# Patient Record
Sex: Female | Born: 1995
Health system: Southern US, Community
[De-identification: ages and names within clinical notes are randomized; demographics above are authoritative.]

## PROBLEM LIST (undated history)

## (undated) DIAGNOSIS — G43909 Migraine, unspecified, not intractable, without status migrainosus: Secondary | ICD-10-CM

## (undated) DIAGNOSIS — R519 Headache, unspecified: Secondary | ICD-10-CM

## (undated) DIAGNOSIS — F4310 Post-traumatic stress disorder, unspecified: Secondary | ICD-10-CM

## (undated) DIAGNOSIS — F509 Eating disorder, unspecified: Secondary | ICD-10-CM

## (undated) DIAGNOSIS — F329 Major depressive disorder, single episode, unspecified: Secondary | ICD-10-CM

## (undated) DIAGNOSIS — F419 Anxiety disorder, unspecified: Secondary | ICD-10-CM

## (undated) DIAGNOSIS — F32A Depression, unspecified: Secondary | ICD-10-CM

## (undated) DIAGNOSIS — R51 Headache: Secondary | ICD-10-CM

## (undated) DIAGNOSIS — F909 Attention-deficit hyperactivity disorder, unspecified type: Secondary | ICD-10-CM

## (undated) DIAGNOSIS — F429 Obsessive-compulsive disorder, unspecified: Secondary | ICD-10-CM

## (undated) DIAGNOSIS — F603 Borderline personality disorder: Secondary | ICD-10-CM

## (undated) DIAGNOSIS — T7840XA Allergy, unspecified, initial encounter: Secondary | ICD-10-CM

## (undated) HISTORY — DX: Anxiety disorder, unspecified: F41.9

## (undated) HISTORY — DX: Attention-deficit hyperactivity disorder, unspecified type: F90.9

## (undated) HISTORY — DX: Headache, unspecified: R51.9

## (undated) HISTORY — DX: Headache: R51

## (undated) HISTORY — DX: Major depressive disorder, single episode, unspecified: F32.9

## (undated) HISTORY — DX: Allergy, unspecified, initial encounter: T78.40XA

## (undated) HISTORY — DX: Eating disorder, unspecified: F50.9

## (undated) HISTORY — DX: Post-traumatic stress disorder, unspecified: F43.10

## (undated) HISTORY — DX: Depression, unspecified: F32.A

## (undated) HISTORY — DX: Migraine, unspecified, not intractable, without status migrainosus: G43.909

---
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
## 2017-01-03 ENCOUNTER — Encounter: Payer: Self-pay | Admitting: Urology

## 2017-01-03 ENCOUNTER — Ambulatory Visit (INDEPENDENT_AMBULATORY_CARE_PROVIDER_SITE_OTHER): Payer: No Typology Code available for payment source | Admitting: Urology

## 2017-01-03 VITALS — BP 117/78 | HR 91 | Ht 62.0 in | Wt 108.0 lb

## 2017-01-03 DIAGNOSIS — N390 Urinary tract infection, site not specified: Secondary | ICD-10-CM

## 2017-01-03 LAB — URINALYSIS, COMPLETE
BILIRUBIN UA: NEGATIVE
GLUCOSE, UA: NEGATIVE
KETONES UA: NEGATIVE
Nitrite, UA: NEGATIVE
PH UA: 7 (ref 5.0–7.5)
PROTEIN UA: NEGATIVE
RBC, UA: NEGATIVE
SPEC GRAV UA: 1.02 (ref 1.005–1.030)
UUROB: 0.2 mg/dL (ref 0.2–1.0)

## 2017-01-03 NOTE — Progress Notes (Signed)
01/03/2017 9:04 AM   Cynthia Castaneda [birth year] [id number]  Referring provider: Malva Limes, MD [address] [place], [place] [address]  Chief Complaint  Patient presents with  . Recurrent UTI    5wk f/u    HPI: The patient in the last 4 or 5 months has had recurrent bladder infections with dysuria frequency and urgency. Normally the symptoms respond antibiotics. She has been off and on the current prescription because she had lost it initially.  At baseline she voids every few hours and has no nocturia. She does have issues with constipation.    Pathophysiology of recurrent bladder infections discussed. Urine sent for culture. Renal ultrasound ordered. Return to clinic and likely recommend urinary prophylaxis. She is sexually active. Having said that I thought it was best to start her on prophylaxis today and see her back with a baseline ultrasound in several weeks  Today Frequency stable. Renal ultrasound normal. The patient is on daily trimethoprim. Last urine culture is negative. She has trouble to swallow medication. She has minimal discomfort at the end of urination  Urinalysis looks negative I sent it again for culture for her mild residual burning.   Overall she is doing great  PMH: Past Medical History:  Diagnosis Date  . ADHD (attention deficit hyperactivity disorder)   . Allergy   . Anxiety   . Depression   . Eating disorder   . Frequent headaches   . Migraine   . PTSD (post-traumatic stress disorder)     Surgical History: No past surgical history on file.  Home Medications:  Allergies as of 01/03/2017      Reactions   Amoxicillin Other (See Comments)   Family history      Medication List       Accurate as of 01/03/17  9:04 AM. Always use your most recent med list.          hyoscyamine 0.125 MG SL tablet Commonly known as:  LEVSIN SL PLACE ONE TABLET UNDER THE TONGUE EVERY FOUR HOURS AS NEEDED FOR CRAMPING   sertraline 50  MG tablet Commonly known as:  ZOLOFT Take 50 mg by mouth daily.   trimethoprim 100 MG tablet Commonly known as:  TRIMPEX Take 1 tablet (100 mg total) by mouth daily.            Discharge Care Instructions        Start     Ordered   01/03/17 0000  Urinalysis, Complete     01/03/17 0845   01/03/17 0000  CULTURE, URINE COMPREHENSIVE     01/03/17 0901      Allergies:  Allergies  Allergen Reactions  . Amoxicillin Other (See Comments)    Family history    Family History: Family History  Problem Relation Age of Onset  . Depression Mother   . Mental illness Mother   . Depression Father   . Mental illness Father   . Depression Brother   . Mental illness Brother   . Bladder Cancer Neg Hx   . Kidney cancer Neg Hx     Social History:  reports that she has never smoked. She has never used smokeless tobacco. She reports that she does not drink alcohol or use drugs.  ROS: UROLOGY Frequent Urination?: No Hard to postpone urination?: No Burning/pain with urination?: Yes Get up at night to urinate?: Yes Leakage of urine?: No Urine stream starts and stops?: No Trouble starting stream?: No Do you have to strain to  urinate?: No Blood in urine?: No Urinary tract infection?: No Sexually transmitted disease?: No Injury to kidneys or bladder?: No Painful intercourse?: No Weak stream?: No Currently pregnant?: No Vaginal bleeding?: No Last menstrual period?: 8/?/2018  Gastrointestinal Nausea?: Yes Vomiting?: No Indigestion/heartburn?: No Diarrhea?: No Constipation?: Yes  Constitutional Fever: No Night sweats?: No Weight loss?: No Fatigue?: No  Skin Skin rash/lesions?: No Itching?: No  Eyes Blurred vision?: No Double vision?: No  Ears/Nose/Throat Sore throat?: No Sinus problems?: No  Hematologic/Lymphatic Swollen glands?: No Easy bruising?: No  Cardiovascular Leg swelling?: No Chest pain?: Yes  Respiratory Cough?: No Shortness of breath?:  No  Endocrine Excessive thirst?: No  Musculoskeletal Back pain?: Yes Joint pain?: No  Neurological Headaches?: Yes Dizziness?: Yes  Psychologic Depression?: Yes Anxiety?: Yes  Physical Exam: BP 117/78   Pulse 91   Ht  (1.575 m)   Wt 108 lb (49 kg)   LMP 12/06/2016 (Approximate)   BMI 19.75 kg/m   Constitutional:  Alert and oriented, No acute distress.   Laboratory Data:  Urinalysis    Component Value Date/Time   APPEARANCEUR Clear 11/28/2016 0926   GLUCOSEU Negative 11/28/2016 0926   BILIRUBINUR Negative 11/28/2016 0926   PROTEINUR Negative 11/28/2016 0926   UROBILINOGEN 0.2 11/03/2016 1200   NITRITE Negative 11/28/2016 0926   LEUKOCYTESUR Negative 11/28/2016 0926    Pertinent Imaging:r Assessment & Plan:  Reassess in one yea  1. Recurrent UTI  - Urinalysis, Complete   Return in about 1 year (around 01/03/2018).  Cynthia Sinner, MD  [facility] [address], [address] [place], [place] [address] [phone]

## 2017-01-06 LAB — CULTURE, URINE COMPREHENSIVE

## 2017-02-23 ENCOUNTER — Other Ambulatory Visit: Payer: Self-pay

## 2017-02-23 MED ORDER — SERTRALINE HCL 50 MG PO TABS: 50.0000 mg | ORAL_TABLET | Freq: Every day | ORAL | 0 refills | Status: DC

## 2017-02-23 NOTE — Telephone Encounter (Signed)
Have sent 30 day prescription, need o.v. Within next month.

## 2017-02-23 NOTE — Telephone Encounter (Signed)
Pt mom Beth called back and states pt is not taking Sertraline.  Pt is taking Lamictal.  Mom is note sure of the the dosage.  Pt has picked this up at [facility].  CB#[phone]/MW

## 2017-02-23 NOTE — Telephone Encounter (Signed)
Patient's mother called requesting a refill on sertraline. She reports that she was seen by psychiatry and she was not happy with them. She is wanting to know if you would be willing to manage her sertraline? She is out of medication. [facility]. Mother's contact info is correct. Thanks!

## 2017-03-03 ENCOUNTER — Encounter: Payer: Self-pay | Admitting: Family Medicine

## 2017-03-03 ENCOUNTER — Ambulatory Visit: Payer: No Typology Code available for payment source | Admitting: Family Medicine

## 2017-03-03 VITALS — BP 100/70 | HR 108 | Temp 98.0°F | Resp 16 | Wt 114.0 lb

## 2017-03-03 DIAGNOSIS — F339 Major depressive disorder, recurrent, unspecified: Secondary | ICD-10-CM | POA: Diagnosis not present

## 2017-03-03 MED ORDER — LAMOTRIGINE ER 50 MG PO TB24: 50.0000 mg | ORAL_TABLET | Freq: Every day | ORAL | 1 refills | Status: DC

## 2017-03-03 NOTE — Telephone Encounter (Signed)
Mother advised and appt Ivor Costamade-Nichoel Digiulio V Moya Duan, RMA

## 2017-03-03 NOTE — Progress Notes (Signed)
       Patient: Cynthia Castaneda Female    DOB: 1996/04/14   20 y.o.   MRN: [id number] Visit Date: 03/03/2017  Today's Provider: Mila Merryonald Fisher, MD   Chief Complaint  Patient presents with  . Follow-up  . Depression   Subjective:    HPI  Depression Previously followed by psychiatry but patient was unhappy with psychiatrist. Dr. Sherrie MustacheFisher refilled Zoloft 50 mg qd on 02/23/2017. She was started on Lamictal with Dr. Maryruth BunKapur which she feels is working well, but she does not want to continue follow up with her. She states she would like to establish with a different psychiatrist.      Allergies  Allergen Reactions  . Amoxicillin Other (See Comments)    Family history     Current Outpatient Medications:  .  LamoTRIgine 50 MG TB24 24 hour tablet, Take 50 mg daily by mouth., Disp: , Rfl:  .  hyoscyamine (LEVSIN SL) 0.125 MG SL tablet, PLACE ONE TABLET UNDER THE TONGUE EVERY FOUR HOURS AS NEEDED FOR CRAMPING, Disp: , Rfl:  .  sertraline (ZOLOFT) 50 MG tablet, Take 1 tablet (50 mg total) daily by mouth. (Patient not taking: Reported on 03/03/2017), Disp: 30 tablet, Rfl: 0 .  trimethoprim (TRIMPEX) 100 MG tablet, Take 1 tablet (100 mg total) by mouth daily. (Patient not taking: Reported on 03/03/2017), Disp: 30 tablet, Rfl: 11  Review of Systems  Constitutional: Negative for appetite change, chills, fatigue and fever.  Respiratory: Negative for chest tightness and shortness of breath.   Cardiovascular: Negative for chest pain and palpitations.  Gastrointestinal: Negative for abdominal pain, nausea and vomiting.  Neurological: Negative for dizziness and weakness.    Social History   Tobacco Use  . Smoking status: Never Smoker  . Smokeless tobacco: Never Used  Substance Use Topics  . Alcohol use: No   Objective:   BP 100/70 (BP Location: Right Arm, Patient Position: Sitting, Cuff Size: Normal)   Pulse (!) 108   Temp 98 F (36.7 C) (Oral)   Resp 16   Wt 114 lb (51.7 kg)   SpO2  99%   BMI 20.85 kg/m  Vitals:   03/03/17 1546  BP: 100/70  Pulse: (!) 108  Resp: 16  Temp: 98 F (36.7 C)  TempSrc: Oral  SpO2: 99%  Weight: 114 lb (51.7 kg)     Physical Exam  General appearance: alert, well developed, well nourished, cooperative and in no distress Head: Normocephalic, without obvious abnormality, atraumatic Respiratory: Respirations even and unlabored, normal respiratory rate Extremities: No gross deformities Skin: Skin color, texture, turgor normal. No rashes seen  Psych: Appropriate mood and affect. Neurologic: Mental status: Alert, oriented to person, place, and time, thought content appropriate.      Assessment & Plan:     1. Episode of recurrent major depressive disorder, unspecified depression episode severity (HCC) Doing will since started on Lamictal by Dr. Maryruth BunKapur, however she will not return to Dr. Maryruth BunKapur for follow up. She is fine with [place] to [place] to establish with another psychiatrist. Continue Lamictal, she is to call if she will run out before establishing with a new psychiatrist.  - Ambulatory referral to Psychiatry       Mila Merryonald Fisher, MD  [facility]

## 2017-05-03 ENCOUNTER — Telehealth: Payer: Self-pay | Admitting: Family Medicine

## 2017-05-03 NOTE — Telephone Encounter (Signed)
Pt's mom Beth contacted office for refill request on the following medications:  LamoTRIgine 50 MG TB24 24 hour tablet  [facility]  Beth stated that she knows Dr. Sherrie MustacheFisher wanted pt to see a psychiatrist to manage this Rx but mom stated she wasn't aware that pt was contacted months ago and given referral info and expected to schedule appt. Beth stated that she has contacted multiple offices and no one can see pt for 6 to 8 weeks. Waynetta SandyBeth is requesting Dr. Sherrie MustacheFisher approve the medication with enough refills to last until pt can be seen by a psychiatrist. Waynetta SandyBeth stated pt took her last dose of medication today. Please advise. Thanks TNP

## 2017-05-04 MED ORDER — LAMOTRIGINE ER 50 MG PO TB24
50.0000 mg | ORAL_TABLET | Freq: Every day | ORAL | 0 refills | Status: DC
Start: 2017-05-04 — End: 2017-08-30

## 2017-05-04 NOTE — Telephone Encounter (Signed)
Please advise 

## 2017-08-30 ENCOUNTER — Encounter: Payer: Self-pay | Admitting: Family Medicine

## 2017-08-30 ENCOUNTER — Ambulatory Visit: Payer: No Typology Code available for payment source | Admitting: Family Medicine

## 2017-08-30 VITALS — BP 116/74 | HR 82 | Temp 98.0°F | Resp 16 | Wt 109.0 lb

## 2017-08-30 DIAGNOSIS — N3090 Cystitis, unspecified without hematuria: Secondary | ICD-10-CM

## 2017-08-30 LAB — POCT URINALYSIS DIPSTICK
Appearance: NORMAL
Blood, UA: NEGATIVE
Glucose, UA: NEGATIVE
Ketones, UA: NEGATIVE
NITRITE UA: POSITIVE
Odor: NORMAL
PROTEIN UA: NEGATIVE
SPEC GRAV UA: 1.02 (ref 1.010–1.025)
Urobilinogen, UA: 0.2 E.U./dL
pH, UA: 6.5 (ref 5.0–8.0)

## 2017-08-30 MED ORDER — SULFAMETHOXAZOLE-TRIMETHOPRIM 800-160 MG PO TABS: 1.0000 | ORAL_TABLET | Freq: Two times a day (BID) | ORAL | 0 refills | Status: AC

## 2017-08-30 NOTE — Patient Instructions (Signed)

## 2017-08-30 NOTE — Progress Notes (Signed)
uirne      Patient: Cynthia Castaneda Female    DOB: [birth year]   21 y.o.   MRN: [id number] Visit Date: 08/30/2017  Today's Provider: Shirlee Latch, MD   I, Joslyn Hy, CMA, am acting as scribe for Shirlee Latch, MD.  Chief Complaint  Patient presents with  . Urinary Tract Infection   Subjective:    Urinary Tract Infection   This is a recurrent problem. The pain is moderate. There has been no fever. Associated symptoms include a discharge, flank pain, frequency, hesitancy, nausea and urgency. Pertinent negatives include no chills, hematuria, sweats or vomiting. Treatments tried: AZO, IBU. The treatment provided no relief. Her past medical history is significant for recurrent UTIs.  Pt was seeing Dr. Sherron Monday (uro) for chronic UTI's. Pt was started on Trimpex for 1 year for this, with relief. Pt recently ran out if medication, and sx reoccurred.      Allergies  Allergen Reactions  . Amoxicillin Other (See Comments)    Family history     Current Outpatient Medications:  .  hyoscyamine (LEVSIN SL) 0.125 MG SL tablet, PLACE ONE TABLET UNDER THE TONGUE EVERY FOUR HOURS AS NEEDED FOR CRAMPING, Disp: , Rfl:  .  LamoTRIgine 50 MG TB24 24 hour tablet, Take 1 tablet (50 mg total) by mouth daily., Disp: 30 tablet, Rfl: 0  Review of Systems  Constitutional: Negative for chills.  Gastrointestinal: Positive for nausea. Negative for vomiting.  Genitourinary: Positive for flank pain, frequency, hesitancy and urgency. Negative for hematuria.    Social History   Tobacco Use  . Smoking status: Never Smoker  . Smokeless tobacco: Never Used  Substance Use Topics  . Alcohol use: No   Objective:   There were no vitals taken for this visit. There were no vitals filed for this visit.   Physical Exam  Constitutional: She is oriented to person, place, and time. She appears well-developed and well-nourished. No distress.  HENT:  Head: Normocephalic and atraumatic.  Eyes:  Conjunctivae are normal. No scleral icterus.  Cardiovascular: Normal rate, regular rhythm, normal heart sounds and intact distal pulses.  No murmur heard. Pulmonary/Chest: Effort normal and breath sounds normal. No respiratory distress. She has no wheezes. She has no rales.  Abdominal: Soft. She exhibits no distension. There is tenderness (mild, suprapubic).  No CVAT  Neurological: She is alert and oriented to person, place, and time.  Skin: Skin is warm and dry. Capillary refill takes less than 2 seconds. No rash noted.  Psychiatric: She has a normal mood and affect. Her behavior is normal.  Vitals reviewed.    Results for orders placed or performed in visit on 08/30/17  POCT urinalysis dipstick  Result Value Ref Range   Color, UA dark yellow    Clarity, UA clear    Glucose, UA negative    Bilirubin, UA small    Ketones, UA negative    Spec Grav, UA 1.020 1.010 - 1.025   Blood, UA negative    pH, UA 6.5 5.0 - 8.0   Protein, UA negative    Urobilinogen, UA 0.2 0.2 or 1.0 E.U./dL   Nitrite, UA positive    Leukocytes, UA Trace (A) Negative   Appearance normal    Odor normal        Assessment & Plan:     1. Cystitis - UA c/w UTI with positive nitrites and trace leuks - no evidence of pyelo - h.o recurrent UTIs, previously more controlled on prophylaxis - encouraged to  f/u with urology - send urine culture for sensitivities and confirmation - return precautions discussed - POCT urinalysis dipstick - Urine Culture    Meds ordered this encounter  Medications  . sulfamethoxazole-trimethoprim (BACTRIM DS,SEPTRA DS) 800-160 MG tablet    Sig: Take 1 tablet by mouth 2 (two) times daily for 5 days.    Dispense:  10 tablet    Refill:  0     Return if symptoms worsen or fail to improve.   The entirety of the information documented in the History of Present Illness, Review of Systems and Physical Exam were personally obtained by me. Portions of this information were  initially documented by Irving Burton Ratchford, CMA and reviewed by me for thoroughness and accuracy.    Erasmo Downer, MD, MPH [facility] 08/30/2017 12:10 PM

## 2017-09-01 ENCOUNTER — Telehealth: Payer: Self-pay

## 2017-09-01 LAB — URINE CULTURE

## 2017-09-01 NOTE — Telephone Encounter (Signed)
Pt advised.

## 2017-09-01 NOTE — Telephone Encounter (Signed)
-----   Message from Erasmo Downer, MD sent at 09/01/2017 11:46 AM EDT ----- Urine culture confirms UTI caused by E. coli.  It is sensitive to the antibiotic prescribed.  Erasmo Downer, MD, MPH [facility] 09/01/2017 11:46 AM

## 2017-09-21 ENCOUNTER — Ambulatory Visit: Payer: No Typology Code available for payment source | Admitting: Family Medicine

## 2017-09-21 VITALS — BP 98/66 | HR 78 | Resp 16 | Wt 110.0 lb

## 2017-09-21 DIAGNOSIS — N3090 Cystitis, unspecified without hematuria: Secondary | ICD-10-CM | POA: Diagnosis not present

## 2017-09-21 DIAGNOSIS — R109 Unspecified abdominal pain: Secondary | ICD-10-CM | POA: Diagnosis not present

## 2017-09-21 LAB — POCT URINALYSIS DIPSTICK
Appearance: NORMAL
BILIRUBIN UA: NEGATIVE
Glucose, UA: NEGATIVE
KETONES UA: NEGATIVE
Leukocytes, UA: NEGATIVE
Nitrite, UA: NEGATIVE
Odor: NORMAL
Protein, UA: NEGATIVE
RBC UA: NEGATIVE
SPEC GRAV UA: 1.025 (ref 1.010–1.025)
Urobilinogen, UA: 0.2 E.U./dL
pH, UA: 6 (ref 5.0–8.0)

## 2017-09-21 MED ORDER — CEPHALEXIN 500 MG PO CAPS: 500.0000 mg | ORAL_CAPSULE | Freq: Two times a day (BID) | ORAL | 0 refills | Status: AC

## 2017-09-21 NOTE — Patient Instructions (Signed)

## 2017-09-21 NOTE — Progress Notes (Signed)
Patient: Cynthia Castaneda Female    DOB: [birth year]   22 y.o.   MRN: [id number] Visit Date: 09/21/2017  Today's Provider: Shirlee Latch, MD   I, Joslyn Hy, CMA, am acting as scribe for Shirlee Latch, MD.  Chief Complaint  Patient presents with  . Urinary Tract Infection   Subjective:    Urinary Tract Infection   This is a recurrent (Pt previously on Trimpex, with relief. Has not seen urology since running out of this medication.) problem. The problem has been gradually worsening. The pain is mild. There has been no fever. Associated symptoms include chills, a discharge, flank pain and urgency. Pertinent negatives include no frequency, hematuria, hesitancy, nausea, sweats or vomiting. Treatments tried: pt was prescribed Bactrim at LOV, and states she was not taking this BID, and did not finish the course. She states she started taking the leftover tablets at onset of these sx, with relief. Her past medical history is significant for recurrent UTIs.   She states that she got L flank pain about 5h after last appt.  This quickly went away when she took Bactrim.  She did not consistently take Bactrim and mostly only took it daily. When she would miss a dose, she would get return of UTI symptoms.  She finished her last pill of Bactrim last week.  Now symptoms have returned.    Allergies  Allergen Reactions  . Amoxicillin Other (See Comments)    Family history     Current Outpatient Medications:  .  DULoxetine (CYMBALTA) 60 MG capsule, Take 60 mg by mouth daily., Disp: , Rfl: 1 .  gabapentin (NEURONTIN) 100 MG capsule, TAKE ONE CAPSULE BY MOUTH TWICE A DAY AS NEEDED, Disp: , Rfl: 1  Review of Systems  Constitutional: Positive for chills.  Gastrointestinal: Negative for nausea and vomiting.  Genitourinary: Positive for flank pain and urgency. Negative for frequency, hematuria and hesitancy.    Social History   Tobacco Use  . Smoking status: Never Smoker  .  Smokeless tobacco: Never Used  Substance Use Topics  . Alcohol use: No   Objective:   BP 98/66 (BP Location: Left Arm, Patient Position: Sitting, Cuff Size: Normal)   Pulse 78   Resp 16   Wt 110 lb (49.9 kg)   SpO2 99%   BMI 20.12 kg/m  Vitals:   09/21/17 1527  BP: 98/66  Pulse: 78  Resp: 16  SpO2: 99%  Weight: 110 lb (49.9 kg)     Physical Exam  Constitutional: She is oriented to person, place, and time. She appears well-developed and well-nourished. No distress.  HENT:  Head: Normocephalic and atraumatic.  Eyes: Conjunctivae are normal. No scleral icterus.  Cardiovascular: Normal rate, regular rhythm, normal heart sounds and intact distal pulses.  No murmur heard. Pulmonary/Chest: Effort normal and breath sounds normal. No respiratory distress. She has no wheezes. She has no rales.  Abdominal: Soft. She exhibits no distension. There is no tenderness. There is no CVA tenderness.  Musculoskeletal: She exhibits no edema.  Neurological: She is alert and oriented to person, place, and time.  Skin: Skin is warm and dry. Capillary refill takes less than 2 seconds. No rash noted.  Psychiatric: She has a normal mood and affect. Her behavior is normal.  Vitals reviewed.   Review of last UA and UCx reveals pan-sensitive E coli  Results for orders placed or performed in visit on 09/21/17  POCT urinalysis dipstick  Result Value Ref Range  Color, UA yellow    Clarity, UA clear    Glucose, UA Negative Negative   Bilirubin, UA negative    Ketones, UA negative    Spec Grav, UA 1.025 1.010 - 1.025   Blood, UA negative    pH, UA 6.0 5.0 - 8.0   Protein, UA Negative Negative   Urobilinogen, UA 0.2 0.2 or 1.0 E.U./dL   Nitrite, UA negative    Leukocytes, UA Negative Negative   Appearance normal    Odor normal       Assessment & Plan:   1. Flank pain 2. Cystitis - UA clean today, but symptoms c/w UTI without pyelonephritis - patient was inadequately treated (by spreading  out her course of Bactrim over weeks) for last known UTI ~3 wks ago - send UCx to confirm - start empiric treatment with Keflex -Patient did not have anaphylactic reaction to amoxicillin and is taking Keflex in the past without issue - discussed importance of taking antibiotic exactly as prescribed and finishing the entire course -If UTIs continue to recur, she should follow-up with urology and possibly get back on suppressive therapy -Return precautions discussed including systemic signs of illness and signs of pyelonephritis   Meds ordered this encounter  Medications  . cephALEXin (KEFLEX) 500 MG capsule    Sig: Take 1 capsule (500 mg total) by mouth 2 (two) times daily for 5 days.    Dispense:  10 capsule    Refill:  0     Return if symptoms worsen or fail to improve.   The entirety of the information documented in the History of Present Illness, Review of Systems and Physical Exam were personally obtained by me. Portions of this information were initially documented by Irving BurtonEmily Ratchford, CMA and reviewed by me for thoroughness and accuracy.    Erasmo DownerBacigalupo, Angela M, MD, MPH [facility] 09/22/2017 8:04 AM

## 2017-10-16 ENCOUNTER — Telehealth: Payer: Self-pay | Admitting: Family Medicine

## 2017-10-16 NOTE — Telephone Encounter (Signed)
Please Review.  Thanks,  -Joseline 

## 2017-10-16 NOTE — Telephone Encounter (Signed)
Pt advised.  Apt made for 10/20/2017   Thanks,   -Vernona RiegerLaura

## 2017-10-16 NOTE — Telephone Encounter (Signed)
Patient's mother states that patient saw Dr. Beryle FlockBacigalupo for pain on 09/21/2017.  She states that she is still having pain and never heard back whether or not she has a kidney infection.  She would like to know if you could call in another antibiotic.  She states that the pain did get a little better but never completely subsided.  [facility] [place]

## 2017-10-16 NOTE — Telephone Encounter (Signed)
She would need to be seen and have urine rechecked.

## 2017-10-20 ENCOUNTER — Ambulatory Visit: Payer: No Typology Code available for payment source | Admitting: Physician Assistant

## 2017-10-20 ENCOUNTER — Encounter: Payer: Self-pay | Admitting: Physician Assistant

## 2017-10-20 ENCOUNTER — Ambulatory Visit
Admission: RE | Admit: 2017-10-20 | Discharge: 2017-10-20 | Disposition: A | Discharge status: Home / Self Care | Payer: No Typology Code available for payment source | Source: Ambulatory Visit | Attending: Physician Assistant | Admitting: Physician Assistant

## 2017-10-20 VITALS — BP 100/60 | HR 96 | Temp 98.3°F | Resp 16 | Wt 105.6 lb

## 2017-10-20 DIAGNOSIS — N3090 Cystitis, unspecified without hematuria: Secondary | ICD-10-CM

## 2017-10-20 DIAGNOSIS — R634 Abnormal weight loss: Secondary | ICD-10-CM

## 2017-10-20 DIAGNOSIS — R109 Unspecified abdominal pain: Secondary | ICD-10-CM | POA: Insufficient documentation

## 2017-10-20 DIAGNOSIS — R300 Dysuria: Secondary | ICD-10-CM | POA: Diagnosis not present

## 2017-10-20 DIAGNOSIS — N898 Other specified noninflammatory disorders of vagina: Secondary | ICD-10-CM

## 2017-10-20 LAB — POCT URINALYSIS DIPSTICK
Bilirubin, UA: NEGATIVE
GLUCOSE UA: NEGATIVE
Ketones, UA: NEGATIVE
Leukocytes, UA: NEGATIVE
NITRITE UA: NEGATIVE
PROTEIN UA: NEGATIVE
RBC UA: NEGATIVE
UROBILINOGEN UA: 0.2 U/dL
pH, UA: 6 (ref 5.0–8.0)

## 2017-10-20 MED ORDER — CIPROFLOXACIN HCL 500 MG PO TABS: 500.0000 mg | ORAL_TABLET | Freq: Two times a day (BID) | ORAL | 0 refills | Status: DC

## 2017-10-20 NOTE — Progress Notes (Signed)
Patient: Cynthia Castaneda Female    DOB: May 14, 1995   22 y.o.   MRN: [id number] Visit Date: 10/20/2017  Today's Provider: Margaretann LovelessJennifer M Burnette, PA-C   Chief Complaint  Patient presents with  . Follow-up    Urine   Subjective:    HPI   Patient coming in today to recheck her urine. Patient was seen in the office 06/06 for pain urine in office showed no bacteria. She reports that she has been having this problem for a year. She reports painful urination,some hematuria,frequency,left lower back pain,pelvic pain (thinks is from her IUD) chills,nausea,urgency and discharge. No fever, no vomiting. She wants an xray done for possible kidney stone.     Allergies  Allergen Reactions  . Amoxicillin Other (See Comments)    Family history     Current Outpatient Medications:  .  DULoxetine (CYMBALTA) 60 MG capsule, Take 60 mg by mouth daily., Disp: , Rfl: 1 .  gabapentin (NEURONTIN) 100 MG capsule, TAKE ONE CAPSULE BY MOUTH TWICE A DAY AS NEEDED, Disp: , Rfl: 1  Review of Systems  Constitutional: Negative.  Negative for fever.  Respiratory: Negative.   Cardiovascular: Negative.   Gastrointestinal: Positive for abdominal distention, abdominal pain and nausea. Negative for constipation, diarrhea and vomiting.  Genitourinary: Positive for dysuria, flank pain, frequency, pelvic pain and vaginal discharge. Negative for hematuria, menstrual problem, vaginal bleeding and vaginal pain.    Social History   Tobacco Use  . Smoking status: Never Smoker  . Smokeless tobacco: Never Used  Substance Use Topics  . Alcohol use: No   Objective:   BP 100/60 (BP Location: Left Arm, Patient Position: Sitting, Cuff Size: Normal)   Pulse 96   Temp 98.3 F (36.8 C) (Oral)   Resp 16   Wt 105 lb 9.6 oz (47.9 kg)   LMP 10/12/2017   SpO2 97%   BMI 19.31 kg/m    Physical Exam  Constitutional: She is oriented to person, place, and time. Vital signs are normal. She appears well-developed and  well-nourished. No distress.  Poor self care  Cardiovascular: Normal rate, regular rhythm and normal heart sounds. Exam reveals no gallop and no friction rub.  No murmur heard. Pulmonary/Chest: Effort normal and breath sounds normal. No respiratory distress. She has no wheezes. She has no rales.  Abdominal: Soft. Normal appearance and bowel sounds are normal. She exhibits no distension and no mass. There is no hepatosplenomegaly. There is tenderness in the right upper quadrant, suprapubic area and left upper quadrant. There is no rebound, no guarding and no CVA tenderness.  Neurological: She is alert and oriented to person, place, and time.  Skin: Skin is warm and dry. She is not diaphoretic.  Vitals reviewed.      Assessment & Plan:     1. Cystitis UA negative, but will treat with cipro empirically due to symptoms while awaiting culture results. Patient incorrectly took Bactrim and did not have symptom relief. Took Keflex approx 2-3 weeks later without complete symptom resolution. I feel there may be something else going on but patient declined pelvic exam for the vaginal discharge she is having because "she has had this for years." She also states "I know this is a urinary tract infection because I have had one constantly for a year and a half." Xray ordered for left flank pain to r/o kidney stone at mother's request. Also patient was noted to have a 5-6 pound weight loss since last seen just  one month ago on 09/21/17. I did not voice my concern with patient as I am not her PCP, nor do I have an established relationship. However, her weight may need to be monitored for continued loss of weight and may need to address eating disorder concerns that could be causing symptoms she is having. She does have h/o eating disorders noted on her problem list. She should f/u with her PCP if culture is negative.  - Urine Culture - POCT urinalysis dipstick - ciprofloxacin (CIPRO) 500 MG tablet; Take 1 tablet (500  mg total) by mouth 2 (two) times daily.  Dispense: 10 tablet; Refill: 0  2. Dysuria See above medical treatment plan. - Urine Culture - POCT urinalysis dipstick  3. Left flank pain See above medical treatment plan. - DG Abd 2 Views; Future  4. Weight loss, unintentional See above medical treatment plan.  5. Vaginal discharge See above medical treatment plan.       Margaretann Loveless, PA-C  [facility]

## 2017-10-20 NOTE — Patient Instructions (Signed)

## 2017-10-22 LAB — URINE CULTURE: Organism ID, Bacteria: NO GROWTH

## 2017-10-23 ENCOUNTER — Telehealth: Payer: Self-pay

## 2017-10-23 NOTE — Telephone Encounter (Signed)
Patient advised as directed below. Patient verbalized understanding.  Thanks,  -Darrelle Wiberg 

## 2017-10-23 NOTE — Telephone Encounter (Signed)
-----   Message from Margaretann LovelessJennifer M Burnette, PA-C sent at 10/23/2017  9:25 AM EDT ----- Urine culture negative. No bacterial infection noted. May stop antibiotics.

## 2017-10-23 NOTE — Telephone Encounter (Signed)
-----   Message from Margaretann LovelessJennifer M Burnette, PA-C sent at 10/23/2017  9:26 AM EDT ----- No stones noted. Only finding that can cause symptoms is you have moderate stool burden. This is indicative of constipation. Would recommend a high fiber diet, increase fluids, and may use Miralax prn.

## 2018-01-01 ENCOUNTER — Ambulatory Visit: Payer: No Typology Code available for payment source | Admitting: Urology

## 2018-01-01 ENCOUNTER — Encounter: Payer: Self-pay | Admitting: Urology

## 2018-03-23 ENCOUNTER — Other Ambulatory Visit: Payer: Self-pay | Admitting: Psychiatry

## 2018-03-23 NOTE — Telephone Encounter (Signed)
Review paper chart  

## 2018-03-26 ENCOUNTER — Other Ambulatory Visit: Payer: Self-pay | Admitting: Psychiatry

## 2018-03-26 DIAGNOSIS — F3341 Major depressive disorder, recurrent, in partial remission: Secondary | ICD-10-CM

## 2018-03-26 MED ORDER — DULOXETINE HCL 60 MG PO CPEP: 60.0000 mg | ORAL_CAPSULE | Freq: Every day | ORAL | 0 refills | Status: DC

## 2018-03-26 NOTE — Telephone Encounter (Signed)
Cynthia Castaneda requests refill of duloxetine only not Metadate or Neurontin sending Cymbalta 60 mg every morning as a month supply and no [facility] [place] [facility] [address] [place] necessary with no contraindication reminder that office appointment is overdue and necessary.

## 2018-03-26 NOTE — Telephone Encounter (Signed)
I put her paper chart in your box, last ov 11/2017  Thanks

## 2018-03-27 NOTE — Telephone Encounter (Signed)
Let front office staff know to schedule her appt.

## 2018-07-16 ENCOUNTER — Telehealth: Payer: Self-pay | Admitting: Psychiatry

## 2018-07-16 DIAGNOSIS — F3341 Major depressive disorder, recurrent, in partial remission: Secondary | ICD-10-CM

## 2018-07-16 MED ORDER — DULOXETINE HCL 60 MG PO CPEP: 60.0000 mg | ORAL_CAPSULE | Freq: Every day | ORAL | 0 refills | Status: DC

## 2018-07-16 NOTE — Addendum Note (Signed)
Addended by: Chauncey Mann on: 07/16/2018 06:12 PM   Modules accepted: Orders

## 2018-07-16 NOTE — Telephone Encounter (Signed)
Pt called need refill on Cymbalta at [facility]. Appt 07/17/2018 @3pm 

## 2018-07-16 NOTE — Telephone Encounter (Signed)
Has office visit schedule for tomorrow with provider

## 2018-07-16 NOTE — Telephone Encounter (Signed)
[facility] and [facility] Cynthia Castaneda request refill Cymbalta 60 mg daily #30 no [facility]ill [facility] [place] medically necessary with no contraindication other than overdue for office appointment now scheduled 07/17/2018 at 1500.

## 2018-07-17 ENCOUNTER — Encounter: Payer: Self-pay | Admitting: Psychiatry

## 2018-07-17 ENCOUNTER — Ambulatory Visit (INDEPENDENT_AMBULATORY_CARE_PROVIDER_SITE_OTHER): Payer: No Typology Code available for payment source | Admitting: Psychiatry

## 2018-07-17 DIAGNOSIS — F902 Attention-deficit hyperactivity disorder, combined type: Secondary | ICD-10-CM

## 2018-07-17 DIAGNOSIS — F3341 Major depressive disorder, recurrent, in partial remission: Secondary | ICD-10-CM

## 2018-07-17 DIAGNOSIS — F429 Obsessive-compulsive disorder, unspecified: Secondary | ICD-10-CM | POA: Insufficient documentation

## 2018-07-17 DIAGNOSIS — F4312 Post-traumatic stress disorder, chronic: Secondary | ICD-10-CM

## 2018-07-17 DIAGNOSIS — F5082 Avoidant/restrictive food intake disorder: Secondary | ICD-10-CM | POA: Insufficient documentation

## 2018-07-17 DIAGNOSIS — F422 Mixed obsessional thoughts and acts: Secondary | ICD-10-CM

## 2018-07-17 MED ORDER — DULOXETINE HCL 60 MG PO CPEP: 60.0000 mg | ORAL_CAPSULE | Freq: Every day | ORAL | 4 refills | Status: DC
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
## 2018-07-25 IMAGING — US US RENAL
1 series · 14 of 25 positions shown · non-contrast
Comparison: None.

CLINICAL DATA: Recurrent urinary tract infections.

EXAM:
RENAL / URINARY TRACT ULTRASOUND COMPLETE

[Series 1: us renal · 0.20mm/px · 14 of 37 slices shown]
[im 1/37]
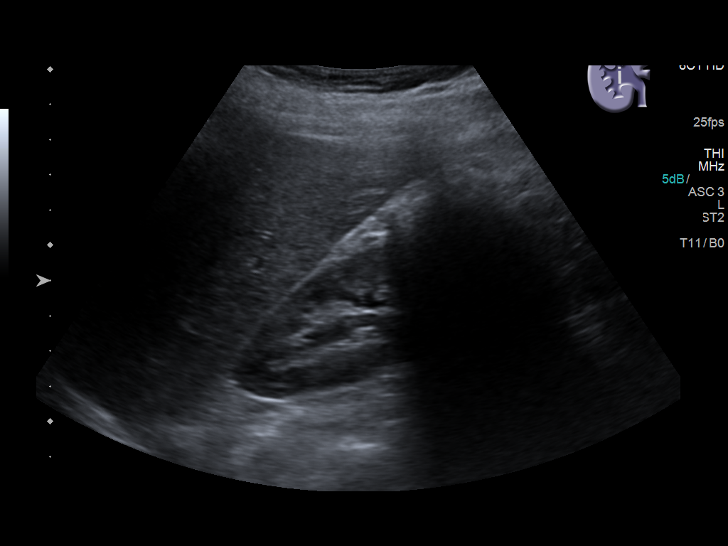
[im 4/37]
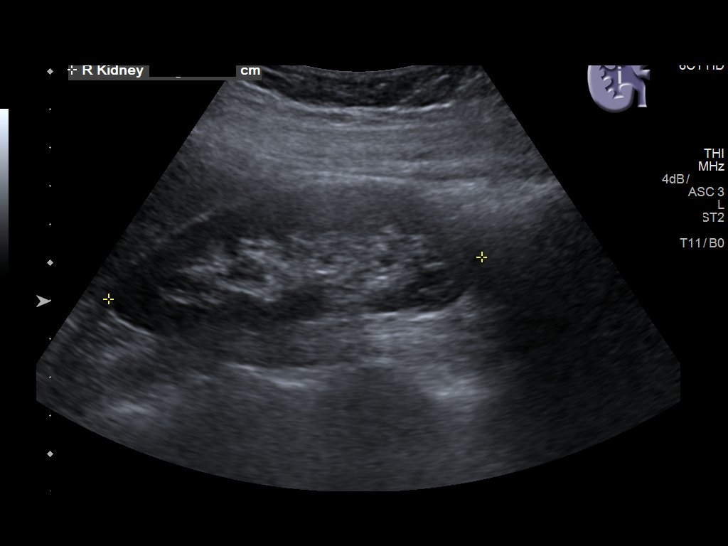
[im 7/37]
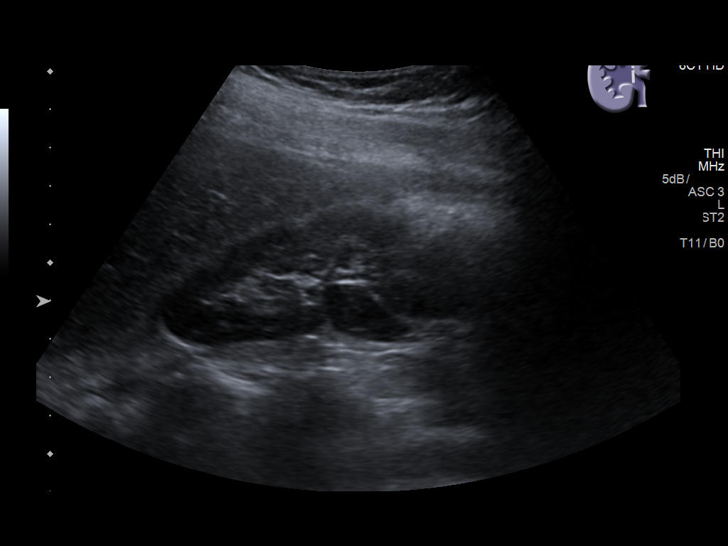
[im 10/37]
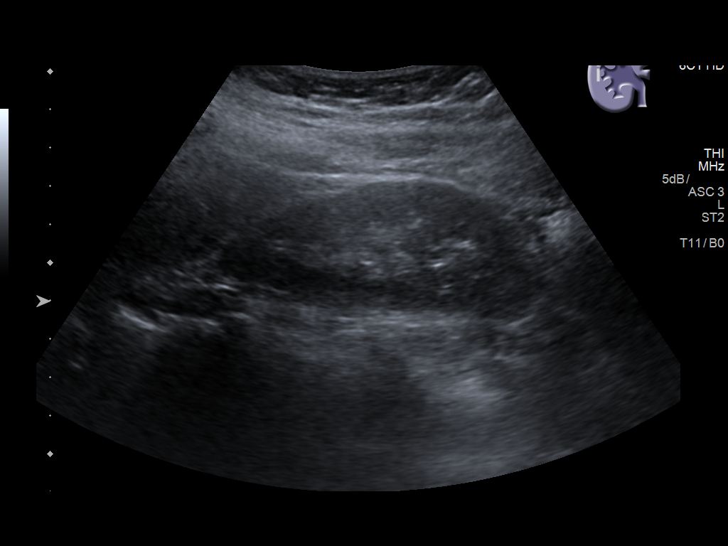
[im 13/37]
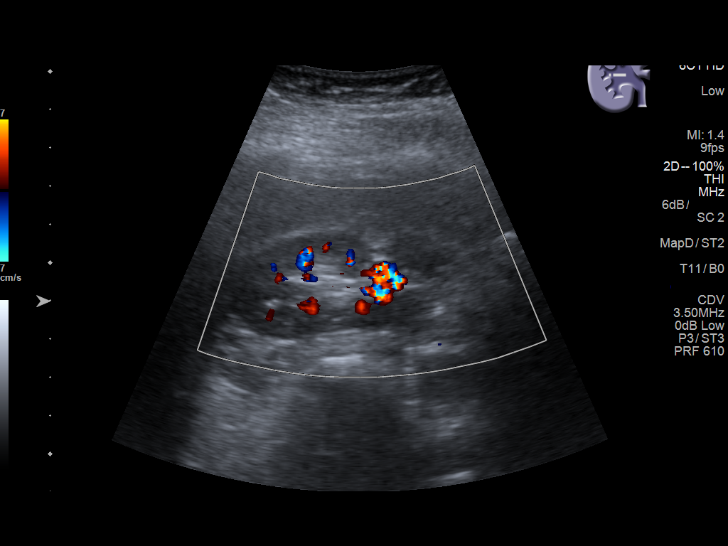
[im 14/37]
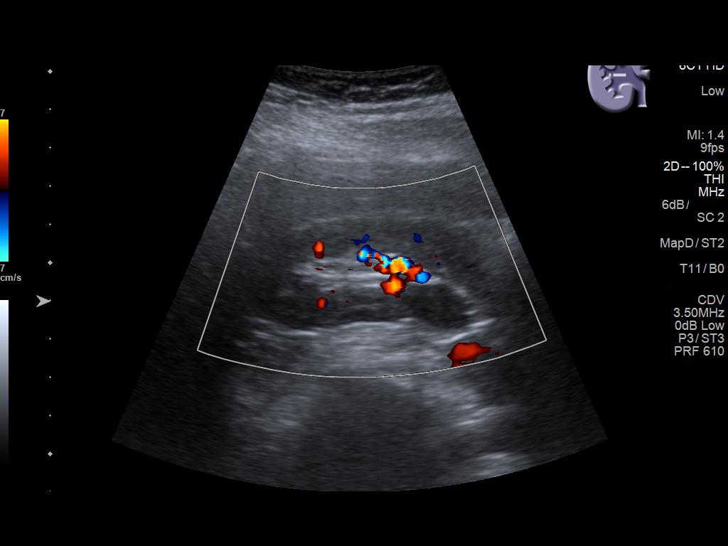
[im 17/37]
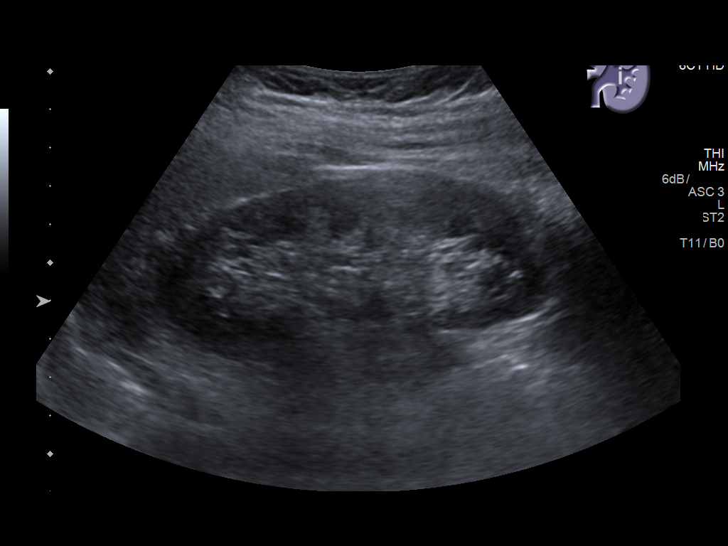
[im 20/37]
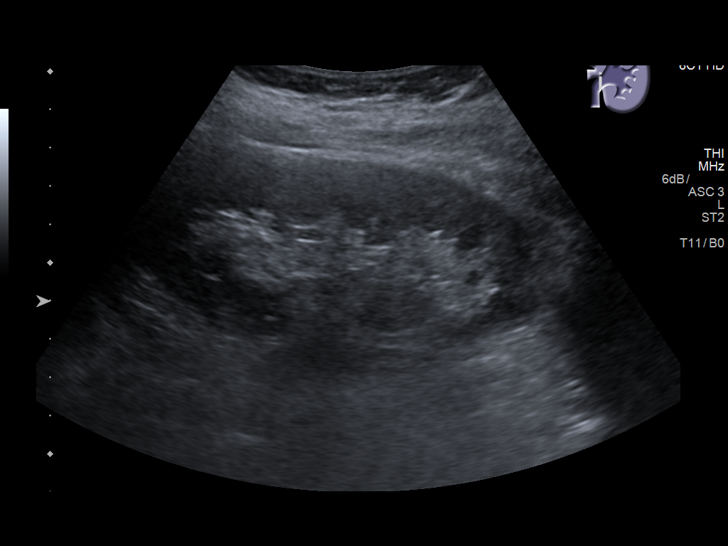
[im 23/37]
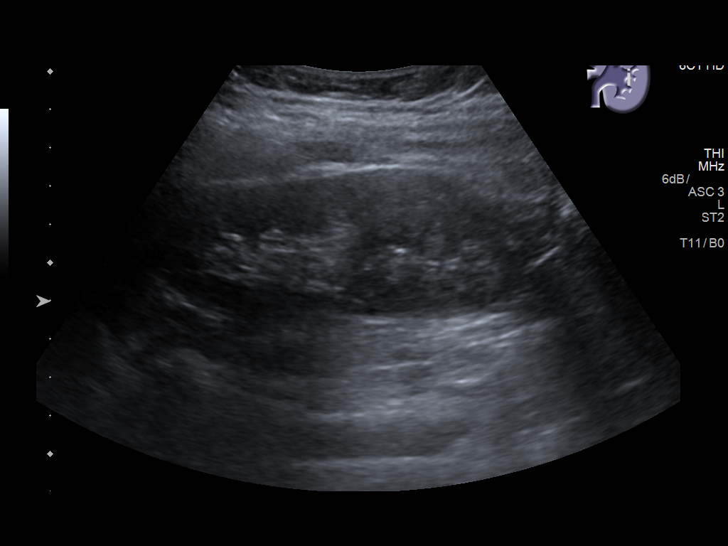
[im 25/37]
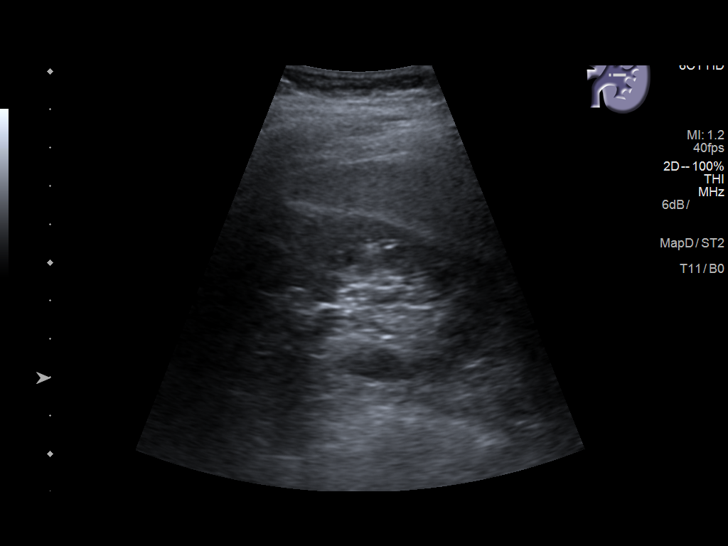
[im 28/37]
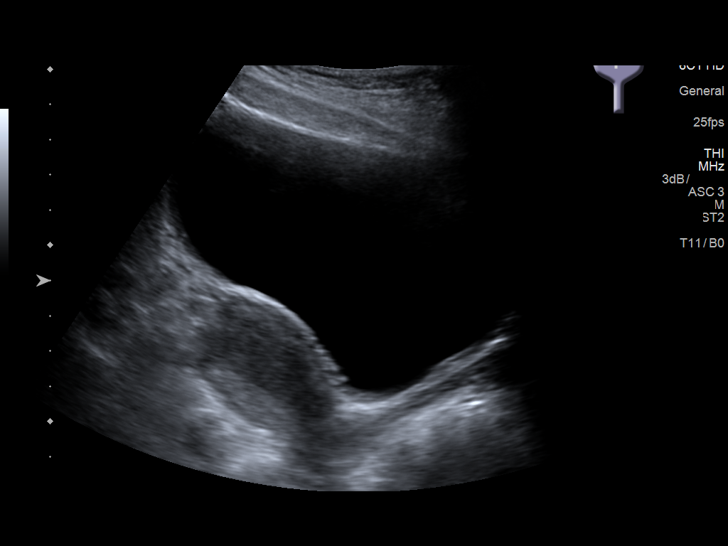
[im 31/37]
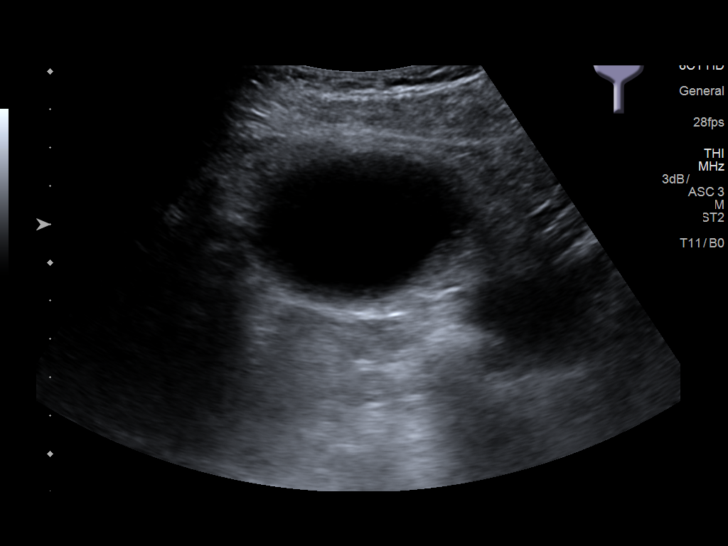
[im 34/37]
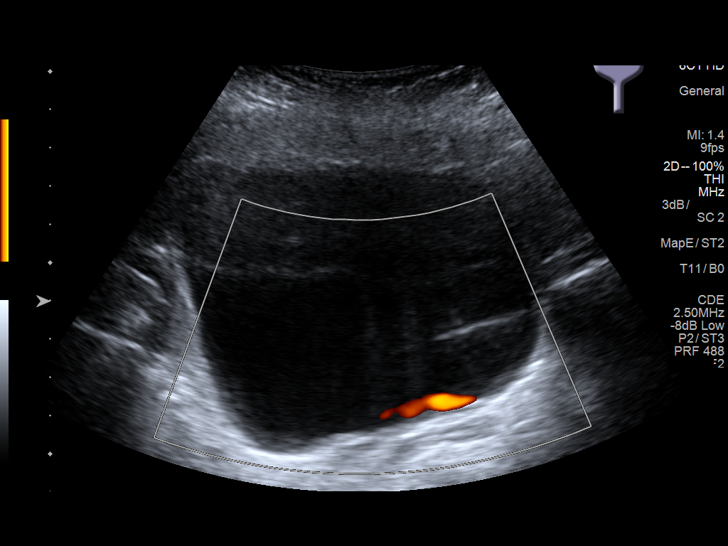
[im 37/37]
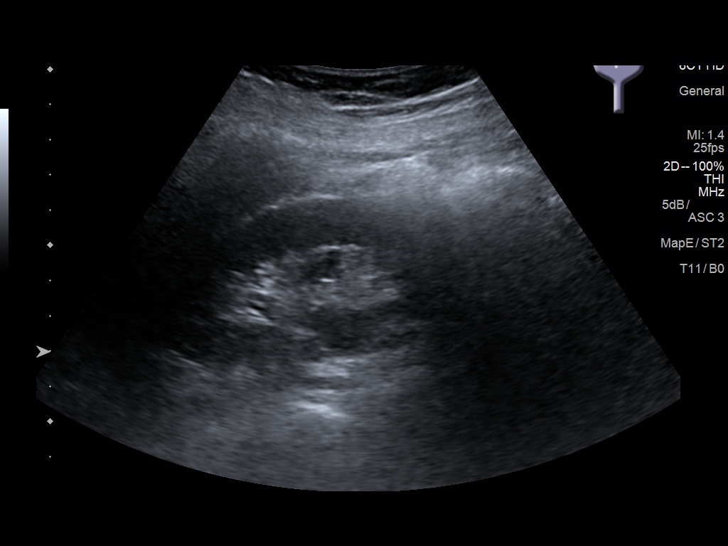

[14 of 25 positions shown; findings below may reference images not displayed]

FINDINGS: Right Kidney:

Length: 9.8 cm. Minimal prominence of the right renal pelvis which
appears less prominent after voiding. Echogenicity within normal
limits. No mass or hydronephrosis visualized.

Left Kidney:

Length: 10.1 cm. Echogenicity within normal limits. No mass or
hydronephrosis visualized.

Bladder:

Appears normal for degree of bladder distention. Bilateral ureteral
jets are identified.
IMPRESSION: Essentially normal exam.

## 2018-08-13 ENCOUNTER — Other Ambulatory Visit: Payer: Self-pay | Admitting: Psychiatry

## 2018-08-14 NOTE — Telephone Encounter (Signed)
At last appointment 07/06/2018, patient only taking her Cymbalta the only refill needed.  She was not using the Metadate, Xanax, or Neurontin at that time.  However the Neurontin was considered helpful for sleep if needed for PTSD. Refill for that purpose is appropriate as patient through pharmacy partially clarifies.

## 2018-08-14 NOTE — Telephone Encounter (Signed)
Doesn't look like she's still taking?

## 2018-11-25 ENCOUNTER — Other Ambulatory Visit: Payer: Self-pay | Admitting: Psychiatry

## 2018-11-25 DIAGNOSIS — F3341 Major depressive disorder, recurrent, in partial remission: Secondary | ICD-10-CM

## 2019-01-31 ENCOUNTER — Encounter: Payer: Self-pay | Admitting: Psychiatry

## 2019-01-31 ENCOUNTER — Other Ambulatory Visit: Payer: Self-pay

## 2019-01-31 ENCOUNTER — Ambulatory Visit (INDEPENDENT_AMBULATORY_CARE_PROVIDER_SITE_OTHER): Payer: No Typology Code available for payment source | Admitting: Psychiatry

## 2019-01-31 VITALS — Ht 62.5 in | Wt 110.0 lb

## 2019-01-31 DIAGNOSIS — F4312 Post-traumatic stress disorder, chronic: Secondary | ICD-10-CM

## 2019-01-31 DIAGNOSIS — F330 Major depressive disorder, recurrent, mild: Secondary | ICD-10-CM | POA: Diagnosis not present

## 2019-01-31 DIAGNOSIS — F422 Mixed obsessional thoughts and acts: Secondary | ICD-10-CM

## 2019-01-31 DIAGNOSIS — F902 Attention-deficit hyperactivity disorder, combined type: Secondary | ICD-10-CM | POA: Diagnosis not present

## 2019-01-31 DIAGNOSIS — F5082 Avoidant/restrictive food intake disorder: Secondary | ICD-10-CM

## 2019-01-31 MED ORDER — DESVENLAFAXINE SUCCINATE ER 50 MG PO TB24: 50.0000 mg | ORAL_TABLET | Freq: Every day | ORAL | 3 refills | Status: DC
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
## 2019-06-03 ENCOUNTER — Ambulatory Visit: Payer: PRIVATE HEALTH INSURANCE | Attending: Internal Medicine

## 2019-06-03 ENCOUNTER — Ambulatory Visit: Payer: No Typology Code available for payment source | Admitting: Psychiatry

## 2019-06-03 DIAGNOSIS — Z20822 Contact with and (suspected) exposure to covid-19: Secondary | ICD-10-CM

## 2019-06-04 LAB — NOVEL CORONAVIRUS, NAA: SARS-CoV-2, NAA: NOT DETECTED

## 2019-07-13 ENCOUNTER — Other Ambulatory Visit: Payer: Self-pay

## 2019-07-13 ENCOUNTER — Inpatient Hospital Stay
Admission: EM | Admit: 2019-07-13 | Discharge: 2019-07-18 | DRG: 885 | Disposition: A | Discharge status: Home / Self Care | Payer: No Typology Code available for payment source | Source: Intra-hospital | Attending: Psychiatry | Admitting: Psychiatry

## 2019-07-13 ENCOUNTER — Emergency Department
Admission: EM | Admit: 2019-07-13 | Discharge: 2019-07-13 | Disposition: A | Discharge status: Other Acute Inpatient Hospital | Payer: No Typology Code available for payment source | Attending: Emergency Medicine | Admitting: Emergency Medicine

## 2019-07-13 ENCOUNTER — Encounter: Payer: Self-pay | Admitting: Psychiatry

## 2019-07-13 DIAGNOSIS — Z681 Body mass index (BMI) 19 or less, adult: Secondary | ICD-10-CM

## 2019-07-13 DIAGNOSIS — Z79899 Other long term (current) drug therapy: Secondary | ICD-10-CM | POA: Insufficient documentation

## 2019-07-13 DIAGNOSIS — Z811 Family history of alcohol abuse and dependence: Secondary | ICD-10-CM | POA: Diagnosis not present

## 2019-07-13 DIAGNOSIS — F419 Anxiety disorder, unspecified: Secondary | ICD-10-CM | POA: Diagnosis present

## 2019-07-13 DIAGNOSIS — F332 Major depressive disorder, recurrent severe without psychotic features: Principal | ICD-10-CM | POA: Diagnosis present

## 2019-07-13 DIAGNOSIS — Z62810 Personal history of physical and sexual abuse in childhood: Secondary | ICD-10-CM | POA: Diagnosis present

## 2019-07-13 DIAGNOSIS — F4312 Post-traumatic stress disorder, chronic: Secondary | ICD-10-CM | POA: Diagnosis present

## 2019-07-13 DIAGNOSIS — F902 Attention-deficit hyperactivity disorder, combined type: Secondary | ICD-10-CM | POA: Diagnosis not present

## 2019-07-13 DIAGNOSIS — F429 Obsessive-compulsive disorder, unspecified: Secondary | ICD-10-CM | POA: Diagnosis present

## 2019-07-13 DIAGNOSIS — G4700 Insomnia, unspecified: Secondary | ICD-10-CM | POA: Diagnosis present

## 2019-07-13 DIAGNOSIS — Z915 Personal history of self-harm: Secondary | ICD-10-CM | POA: Diagnosis not present

## 2019-07-13 DIAGNOSIS — R45851 Suicidal ideations: Secondary | ICD-10-CM | POA: Diagnosis present

## 2019-07-13 DIAGNOSIS — F5082 Avoidant/restrictive food intake disorder: Secondary | ICD-10-CM | POA: Diagnosis present

## 2019-07-13 DIAGNOSIS — Z20822 Contact with and (suspected) exposure to covid-19: Secondary | ICD-10-CM | POA: Diagnosis present

## 2019-07-13 DIAGNOSIS — Z880 Allergy status to penicillin: Secondary | ICD-10-CM

## 2019-07-13 DIAGNOSIS — F331 Major depressive disorder, recurrent, moderate: Secondary | ICD-10-CM | POA: Diagnosis present

## 2019-07-13 DIAGNOSIS — Z818 Family history of other mental and behavioral disorders: Secondary | ICD-10-CM | POA: Diagnosis not present

## 2019-07-13 HISTORY — DX: Borderline personality disorder: F60.3

## 2019-07-13 HISTORY — DX: Obsessive-compulsive disorder, unspecified: F42.9

## 2019-07-13 LAB — URINE DRUG SCREEN, QUALITATIVE (ARMC ONLY)
Amphetamines, Ur Screen: NOT DETECTED
Barbiturates, Ur Screen: NOT DETECTED
Benzodiazepine, Ur Scrn: NOT DETECTED
Cannabinoid 50 Ng, Ur ~~LOC~~: NOT DETECTED
Cocaine Metabolite,Ur ~~LOC~~: NOT DETECTED
MDMA (Ecstasy)Ur Screen: NOT DETECTED
Methadone Scn, Ur: NOT DETECTED
Opiate, Ur Screen: NOT DETECTED
Phencyclidine (PCP) Ur S: NOT DETECTED
Tricyclic, Ur Screen: NOT DETECTED

## 2019-07-13 LAB — RESPIRATORY PANEL BY RT PCR (FLU A&B, COVID)
Influenza A by PCR: NEGATIVE
Influenza B by PCR: NEGATIVE
SARS Coronavirus 2 by RT PCR: NEGATIVE

## 2019-07-13 LAB — POCT PREGNANCY, URINE: Preg Test, Ur: NEGATIVE

## 2019-07-13 MED ORDER — DESVENLAFAXINE SUCCINATE ER 50 MG PO TB24
50.0000 mg | ORAL_TABLET | Freq: Every day | ORAL | Status: DC
  Administered 2019-07-13 – 2019-07-14 (×2): 50 mg via ORAL
  Filled 2019-07-13 (×5): qty 1

## 2019-07-13 MED ORDER — DESVENLAFAXINE SUCCINATE ER 50 MG PO TB24
50.0000 mg | ORAL_TABLET | Freq: Every day | ORAL | Status: DC
  Filled 2019-07-13 (×2): qty 1

## 2019-07-13 MED ORDER — ACETAMINOPHEN 325 MG PO TABS
650.0000 mg | ORAL_TABLET | Freq: Four times a day (QID) | ORAL | Status: DC | PRN
  Filled 2019-07-13: qty 2

## 2019-07-13 MED ORDER — DESVENLAFAXINE SUCCINATE ER 50 MG PO TB24: 50.0000 mg | ORAL_TABLET | Freq: Every day | ORAL | Status: DC

## 2019-07-13 MED ORDER — MAGNESIUM HYDROXIDE 400 MG/5ML PO SUSP: 30.0000 mL | Freq: Every day | ORAL | Status: DC | PRN

## 2019-07-13 MED ORDER — ALUM & MAG HYDROXIDE-SIMETH 200-200-20 MG/5ML PO SUSP: 30.0000 mL | ORAL | Status: DC | PRN

## 2019-07-13 NOTE — ED Notes (Signed)
Pt. Requested and was given another mask, and a tv remote.

## 2019-07-13 NOTE — Tx Team (Signed)
Initial Treatment Plan 07/13/2019 1:53 PM [name] PIO:[phone]    PATIENT STRESSORS: Loss of boyfriend Medication change or noncompliance   PATIENT STRENGTHS: Average or above average intelligence Capable of independent living Communication skills General fund of knowledge   PATIENT IDENTIFIED PROBLEMS: Major Depressive Disorder  PTSD                   DISCHARGE CRITERIA:  Improved stabilization in mood, thinking, and/or behavior  PRELIMINARY DISCHARGE PLAN: Outpatient therapy  PATIENT/FAMILY INVOLVEMENT: This treatment plan has been presented to and reviewed with the patient, [name], and/or family member, .  The patient and family have been given the opportunity to ask questions and make suggestions.  Shelia Media, RN 07/13/2019, 1:53 PM

## 2019-07-13 NOTE — ED Notes (Signed)
HIPPA compliant update provided to her mother  Beth  [phone]

## 2019-07-13 NOTE — ED Provider Notes (Signed)
[facility] Emergency Department Provider Note  ____________________________________________  Time seen: Approximately 6:46 AM  I have reviewed the triage vital signs and the nursing notes.   HISTORY  Chief Complaint Suicidal   HPI Cynthia Castaneda is a 24 y.o. female with a history of PTSD, depression, OCD, borderline personality disorder, ADHD, sensory hallucinations who presents voluntarily for suicidal thoughts.  Patient reports that she has been extremely depressed since her boyfriend broke up with her on Valentine's Day.  She has been having 6 weeks of active suicidal thoughts.  She does not have an active plan but she is unable to contract for safety.  She denies any prior history of suicidal attempt but has had suicidal thoughts in the past.  She denies alcohol or drug use.  She is on Pristiq endorses compliance with her medications.   Past Medical History:  Diagnosis Date  . ADHD (attention deficit hyperactivity disorder)   . Allergy   . Anxiety   . Borderline personality disorder ([place])   . Depression   . Eating disorder   . Frequent headaches   . Migraine   . OCD (obsessive compulsive disorder)   . PTSD (post-traumatic stress disorder)     Patient Active Problem List   Diagnosis Date Noted  . Mild recurrent major depression ([place]) 01/31/2019  . Chronic post-traumatic stress disorder 07/17/2018  . Attention deficit hyperactivity disorder (ADHD), combined type, moderate 07/17/2018  . Obsessive compulsive disorder 07/17/2018  . Avoidant-restrictive food intake disorder (ARFID) 07/17/2018    History reviewed. No pertinent surgical history.  Prior to Admission medications   Medication Sig Start Date End Date Taking? Authorizing Provider  ciprofloxacin (CIPRO) 500 MG tablet Take 1 tablet (500 mg total) by mouth 2 (two) times daily. 10/20/17   Mar Daring, PA-C  desvenlafaxine (PRISTIQ) 50 MG 24 hr tablet Take 1 tablet (50 mg total) by  mouth daily after supper. 01/31/19   Delight Hoh, MD    Allergies Amoxicillin  Family History  Problem Relation Age of Onset  . Depression Mother   . Mental illness Mother   . Depression Father   . Mental illness Father   . Depression Brother   . Mental illness Brother   . Bladder Cancer Neg Hx   . Kidney cancer Neg Hx     Social History Social History   Tobacco Use  . Smoking status: Never Smoker  . Smokeless tobacco: Never Used  Substance Use Topics  . Alcohol use: Not Currently  . Drug use: No    Review of Systems  Constitutional: Negative for fever. Eyes: Negative for visual changes. ENT: Negative for sore throat. Neck: No neck pain  Cardiovascular: Negative for chest pain. Respiratory: Negative for shortness of breath. Gastrointestinal: Negative for abdominal pain, vomiting or diarrhea. Genitourinary: Negative for dysuria. Musculoskeletal: Negative for back pain. Skin: Negative for rash. Neurological: Negative for headaches, weakness or numbness. Psych: + Depression and SI. no HI  ____________________________________________   PHYSICAL EXAM:  VITAL SIGNS: ED Triage Vitals  Enc Vitals Group     BP 07/13/19 0442 (!) 152/91     Pulse Rate 07/13/19 0442 (!) 102     Resp 07/13/19 0442 18     Temp 07/13/19 0442 98.3 F (36.8 C)     Temp src --      SpO2 07/13/19 0442 99 %     Weight 07/13/19 0445 110 lb (49.9 kg)     Height 07/13/19 0445 5\' 3"  (  1.6 m)     Head Circumference --      Peak Flow --      Pain Score 07/13/19 0444 6     Pain Loc --      Pain Edu? --      Excl. in GC? --     Constitutional: Alert and oriented. Well appearing and in no apparent distress. HEENT:      Head: Normocephalic and atraumatic.         Eyes: Conjunctivae are normal. Sclera is non-icteric.       Mouth/Throat: Mucous membranes are moist.       Neck: Supple with no signs of meningismus. Cardiovascular: Regular rate and rhythm.  Respiratory: Normal  respiratory effort.  Gastrointestinal: Soft, non tender, and non distended. Musculoskeletal: No edema, cyanosis, or erythema of extremities. Neurologic: Normal speech and language. Face is symmetric. Moving all extremities. No gross focal neurologic deficits are appreciated. Skin: Skin is warm, dry and intact. No rash noted. Psychiatric: Mood and affect are normal. Speech and behavior are normal.  ____________________________________________   LABS (all labs ordered are listed, but only abnormal results are displayed)  Labs Reviewed  URINE DRUG SCREEN, QUALITATIVE ([facility] ONLY)  POCT PREGNANCY, URINE  POC URINE PREG, ED   ____________________________________________  EKG  none  ____________________________________________  RADIOLOGY  none  ____________________________________________   PROCEDURES  Procedure(s) performed: None Procedures Critical Care performed:  None ____________________________________________   INITIAL IMPRESSION / ASSESSMENT AND PLAN / ED COURSE   24 y.o. female with a history of PTSD, depression, OCD, borderline personality disorder, ADHD, sensory hallucinations who presents voluntarily for suicidal thoughts due to severe depression since her boyfriend broke up with her on Valentine's Day.  Patient denies a plan but she is unable to contract for safety at this time.  She is afraid that if she is discharged she might try to kill herself.  No labs for medical clearance were done since patient is young and otherwise healthy and has an extreme phobia and anxiety around needles. IVC papers were taken. UDS clean.Patient is medically clear for psychiatric disposition.      The patient has been placed in psychiatric observation due to the need to provide a safe environment for the patient while obtaining psychiatric consultation and evaluation, as well as ongoing medical and medication management to treat the patient's condition.  The patient has been placed  under full IVC at this time.    Please note:  Patient was evaluated in Emergency Department today for the symptoms described in the history of present illness. Patient was evaluated in the context of the global COVID-19 pandemic, which necessitated consideration that the patient might be at risk for infection with the SARS-CoV-2 virus that causes COVID-19. Institutional protocols and algorithms that pertain to the evaluation of patients at risk for COVID-19 are in a state of rapid change based on information released by regulatory bodies including the CDC and federal and state organizations. These policies and algorithms were followed during the patient's care in the ED.  Some ED evaluations and interventions may be delayed as a result of limited staffing during the pandemic.   As part of my medical decision making, I reviewed the following data within the electronic MEDICAL RECORD NUMBER Nursing notes reviewed and incorporated, Labs reviewed , A consult was requested and obtained from this/these consultant(s) psychiatry, Notes from prior ED visits and [place] Controlled Substance Database   ____________________________________________   FINAL CLINICAL IMPRESSION(S) / ED DIAGNOSES  Final diagnoses:  Suicidal thoughts  Severe episode of recurrent major depressive disorder, with psychotic features (HCC)      NEW MEDICATIONS STARTED DURING THIS VISIT:  ED Discharge Orders    None       Note:  This document was prepared using Dragon voice recognition software and may include unintentional dictation errors.    Don Perking, [place], MD 07/13/19 [phone]

## 2019-07-13 NOTE — ED Triage Notes (Signed)
Patient c/o SI. Patient reports past hx of self-harm/self-mutilation. Patient denies past hx of SI attempt or hospitalizations.
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
## 2019-07-13 NOTE — Plan of Care (Signed)
  Problem: Education: Goal: Emotional status will improve Outcome: Progressing Goal: Mental status will improve Outcome: Progressing   Problem: Activity: Goal: Interest or engagement in activities will improve Outcome: Progressing   Problem: Coping: Goal: Ability to verbalize frustrations and anger appropriately will improve Outcome: Progressing Goal: Ability to demonstrate self-control will improve Outcome: Progressing   Problem: Health Behavior/Discharge Planning: Goal: Compliance with treatment plan for underlying cause of condition will improve Outcome: Progressing

## 2019-07-13 NOTE — Consult Note (Signed)
[facility] Face-to-Face Psychiatry Consult   Reason for Consult:  Suicidal ideatoins Referring Physician:  EDP Patient Identification: Cynthia Castaneda MRN:  [id number] Principal Diagnosis: Major depressive disorder, recurrent severe without psychotic features (HCC) Diagnosis:  Principal Problem:   Major depressive disorder, recurrent severe without psychotic features (HCC) Active Problems:   Chronic post-traumatic stress disorder   Attention deficit hyperactivity disorder (ADHD), combined type, moderate   Obsessive compulsive disorder  Total Time spent with patient: 1 hour  Subjective:   Cynthia Castaneda is a 24 y.o. female patient admitted with suicidal ideations, will not contract for safety.  Patient seen and evaluated in person by this provider for suicidal ideations.  Her depression is a 10/10 which started when her boyfriend broke up with her six weeks ago and suicidal ideations began. Ruminating about him makes her symptoms worse and sleep makes it better.  Endorses feelings of hopelessness, helplessness, and worthlessness.  No past suicide attempts.  She does go to outpatient psychiatric services.  No substance use, no homicidal ideations, no hallucinations.  Forwarded minimal information on assessment, cooperative and pleasant.  Agreeable to inpatient admission for stabilization.  HPI per MD on admission:  Cynthia Castaneda is a 24 y.o. female with a history of PTSD, depression, OCD, borderline personality disorder, ADHD, sensory hallucinations who presents voluntarily for suicidal thoughts.  Patient reports that she has been extremely depressed since her boyfriend broke up with her on Valentine's Day.  She has been having 6 weeks of active suicidal thoughts.  She does not have an active plan but she is unable to contract for safety.  She denies any prior history of suicidal attempt but has had suicidal thoughts in the past.  She denies alcohol or drug use.  She is on Pristiq endorses  compliance with her medications.  Past Psychiatric History: PTSD, depression, OCD, cluster B traits, ADHD  Risk to Self:  yes Risk to Others:  none Prior Inpatient Therapy:  none Prior Outpatient Therapy:  yes  Past Medical History:  Past Medical History:  Diagnosis Date  . ADHD (attention deficit hyperactivity disorder)   . Allergy   . Anxiety   . Borderline personality disorder (HCC)   . Depression   . Eating disorder   . Frequent headaches   . Migraine   . OCD (obsessive compulsive disorder)   . PTSD (post-traumatic stress disorder)    History reviewed. No pertinent surgical history. Family History:  Family History  Problem Relation Age of Onset  . Depression Mother   . Mental illness Mother   . Depression Father   . Mental illness Father   . Depression Brother   . Mental illness Brother   . Bladder Cancer Neg Hx   . Kidney cancer Neg Hx    Family Psychiatric  History: see above Social History:  Social History   Substance and Sexual Activity  Alcohol Use Not Currently     Social History   Substance and Sexual Activity  Drug Use No    Social History   Socioeconomic History  . Marital status: Single    Spouse name: Not on file  . Number of children: Not on file  . Years of education: Not on file  . Highest education level: Not on file  Occupational History  . Not on file  Tobacco Use  . Smoking status: Never Smoker  . Smokeless tobacco: Never Used  Substance and Sexual Activity  . Alcohol use: Not Currently  . Drug use: No  .  Sexual activity: Not on file  Other Topics Concern  . Not on file  Social History Narrative  . Not on file   Social Determinants of Health   Financial Resource Strain:   . Difficulty of Paying Living Expenses:   Food Insecurity:   . Worried About Programme researcher, broadcasting/film/video in the Last Year:   . Barista in the Last Year:   Transportation Needs:   . Freight forwarder (Medical):   Marland Kitchen Lack of Transportation  (Non-Medical):   Physical Activity:   . Days of Exercise per Week:   . Minutes of Exercise per Session:   Stress:   . Feeling of Stress :   Social Connections:   . Frequency of Communication with Friends and Family:   . Frequency of Social Gatherings with Friends and Family:   . Attends Religious Services:   . Active Member of Clubs or Organizations:   . Attends Banker Meetings:   Marland Kitchen Marital Status:    Additional Social History:    Allergies:   Allergies  Allergen Reactions  . Amoxicillin Other (See Comments)    Family history    Labs:  Results for orders placed or performed during the hospital encounter of 07/13/19 (from the past 48 hour(s))  Urine Drug Screen, Qualitative     Status: None   Collection Time: 07/13/19  5:34 AM  Result Value Ref Range   Tricyclic, Ur Screen NONE DETECTED NONE DETECTED   Amphetamines, Ur Screen NONE DETECTED NONE DETECTED   MDMA (Ecstasy)Ur Screen NONE DETECTED NONE DETECTED   Cocaine Metabolite,Ur [place] NONE DETECTED NONE DETECTED   Opiate, Ur Screen NONE DETECTED NONE DETECTED   Phencyclidine (PCP) Ur S NONE DETECTED NONE DETECTED   Cannabinoid 50 Ng, Ur [place] NONE DETECTED NONE DETECTED   Barbiturates, Ur Screen NONE DETECTED NONE DETECTED   Benzodiazepine, Ur Scrn NONE DETECTED NONE DETECTED   Methadone Scn, Ur NONE DETECTED NONE DETECTED    Comment: (NOTE) Tricyclics + metabolites, urine    Cutoff 1000 ng/mL Amphetamines + metabolites, urine  Cutoff 1000 ng/mL MDMA (Ecstasy), urine              Cutoff 500 ng/mL Cocaine Metabolite, urine          Cutoff 300 ng/mL Opiate + metabolites, urine        Cutoff 300 ng/mL Phencyclidine (PCP), urine         Cutoff 25 ng/mL Cannabinoid, urine                 Cutoff 50 ng/mL Barbiturates + metabolites, urine  Cutoff 200 ng/mL Benzodiazepine, urine              Cutoff 200 ng/mL Methadone, urine                   Cutoff 300 ng/mL The urine drug screen provides only a preliminary,  unconfirmed analytical test result and should not be used for non-medical purposes. Clinical consideration and professional judgment should be applied to any positive drug screen result due to possible interfering substances. A more specific alternate chemical method must be used in order to obtain a confirmed analytical result. Gas chromatography / mass spectrometry (GC/MS) is the preferred confirmat ory method. Performed at [facility], [address] [place], [place] [address]   Pregnancy, urine POC     Status: None   Collection Time: 07/13/19  5:40 AM  Result Value Ref Range  Preg Test, Ur NEGATIVE NEGATIVE    Comment:        THE SENSITIVITY OF THIS METHODOLOGY IS >24 mIU/mL     Current Facility-Administered Medications  Medication Dose Route Frequency Provider Last Rate Last Admin  . desvenlafaxine (PRISTIQ) 24 hr tablet 50 mg  50 mg Oral QPC supper Patrecia Pour, NP       Current Outpatient Medications  Medication Sig Dispense Refill  . ciprofloxacin (CIPRO) 500 MG tablet Take 1 tablet (500 mg total) by mouth 2 (two) times daily. 10 tablet 0  . desvenlafaxine (PRISTIQ) 50 MG 24 hr tablet Take 1 tablet (50 mg total) by mouth daily after supper. 30 tablet 3    Musculoskeletal: Strength & Muscle Tone: decreased Gait & Station: normal Patient leans: N/A  Psychiatric Specialty Exam: Physical Exam  Nursing note and vitals reviewed. Constitutional: She is oriented to person, place, and time. She appears well-developed and well-nourished.  HENT:  Head: Normocephalic.  Respiratory: Effort normal.  Musculoskeletal:        General: Normal range of motion.     Cervical back: Normal range of motion.  Neurological: She is alert and oriented to person, place, and time.  Psychiatric: Her speech is normal and behavior is normal. Her mood appears anxious. Cognition and memory are normal. She expresses impulsivity. She exhibits a depressed mood. She expresses suicidal  ideation. She expresses suicidal plans.    Review of Systems  Psychiatric/Behavioral: Positive for dysphoric mood and suicidal ideas. The patient is nervous/anxious.   All other systems reviewed and are negative.   Blood pressure (!) 152/91, pulse (!) 102, temperature 98.3 F (36.8 C), resp. rate 18, height 5\' 3"  (1.6 m), weight 49.9 kg, SpO2 99 %.Body mass index is 19.49 kg/m.  General Appearance: Casual  Eye Contact:  Fair  Speech:  Normal Rate  Volume:  Decreased  Mood:  Anxious and Depressed  Affect:  Congruent and Depressed  Thought Process:  Coherent and Descriptions of Associations: Intact  Orientation:  Full (Time, Place, and Person)  Thought Content:  Rumination  Suicidal Thoughts:  Yes.  with intent/plan  Homicidal Thoughts:  No  Memory:  Immediate;   Fair Recent;   Fair Remote;   Fair  Judgement:  Fair  Insight:  Fair  Psychomotor Activity:  Decreased  Concentration:  Concentration: Fair and Attention Span: Fair  Recall:  [employer] of Knowledge:  Fair  Language:  Fair  Akathisia:  No  Handed:  Right  AIMS (if indicated):     Assets:  Housing Leisure Time Physical Health Resilience Social Support  ADL's:  Intact  Cognition:  WNL  Sleep:      24 yo female with history of PTSD, depression, OCD, borderline personality d/o, and ADHD presents to the ED with suicidal ideations for the past six weeks initiated by her relationship ending on Valentine's Day.  No prior suicide attempts and no concrete plan, will not contract for safety.  No homicidal ideations, hallucinations, or substance abuse.  Treatment Plan Summary: Daily contact with patient to assess and evaluate symptoms and progress in treatment, Medication management and Plan major depressive disorder, severe, without psychosis:  -Continue Pristiq 50 mg daily -Admit to [facility]  Disposition: Recommend psychiatric Inpatient admission when medically cleared.  Waylan Boga, NP 07/13/2019 10:14  AM

## 2019-07-13 NOTE — Progress Notes (Signed)
Patient alert and oriented x4. Pleasant and personable. Presents as needy at times especially when addressing issue with boyfriend and expressing fear of not being able to get to sleep without him. Patient appetite is good and observed interacting well with peers. She received her scheduled medication and tolerated without incident. Patient is safe with 15 minute safety checks.  Patient  informed to notify staff with any problems or concerns.

## 2019-07-13 NOTE — Progress Notes (Signed)
Patient presents to unit tearful, stating the doctors will hold what the nurses upstairs said against her. Reports overheard nurses saying she is borderline. Pt currently denies SI, HI, AVH. Reports her boyfriend broke up with her is why she was suicidal. Skin and contraband search completed and witnessed by [place]. No skin issues noted other than some red marks where patient scratched herself. No contraband found. Oriented patient to room and unit. Fluid and nutrition offered. Safety checks maintained. Pt receptive and remains safe on unit with q 15 min checks.

## 2019-07-13 NOTE — ED Notes (Signed)
Pt. Here voluntarily for SI thoughts.  Pt. States she has had these thoughts for the past 6 weeks.  Pt. States break-up on valentines day.  Pt. States hx of self-harm, but pt. Was vague on how she harms self.  No visible marks noticed by this nurse.  Pt. Calm and cooperative.

## 2019-07-13 NOTE — ED Notes (Signed)
Pt. Talking to EDP in room #23.

## 2019-07-13 NOTE — ED Notes (Signed)
Patient changed into hospital provided scrubs by this RN and by NT [facility]. Patient's belonging's placed into labeled bag. Patient's belonging's include:  Pair red shoes, pair grey leggings, pair panties, white t-shirt, black coat, black cell phone with card case attached with 2 cards in it.

## 2019-07-13 NOTE — [facility] Group Notes (Signed)
LCSW Group Therapy Note  07/13/2019 1:00pm  Type of Therapy and Topic:  Group Therapy:  Cognitive Distortions  Participation Level:  Minimal   Description of Group:    Patients in this group will be introduced to the topic of cognitive distortions.  Patients will identify and describe cognitive distortions, describe the feelings these distortions create for them.  Patients will identify one or more situations in their personal life where they have cognitively distorted thinking and will verbalize challenging this cognitive distortion through positive thinking skills.  Patients will practice the skill of using positive affirmations to challenge cognitive distortions using affirmation cards.    Therapeutic Goals:  1. Patient will identify two or more cognitive distortions they have used 2. Patient will identify one or more emotions that stem from use of a cognitive distortion 3. Patient will demonstrate use of a positive affirmation to counter a cognitive distortion through discussion and/or role play. 4. Patient will describe one way cognitive distortions can be detrimental to wellness   Summary of Patient Progress:  The pt attended the group and participated by identifying a cognitive distortion that she currently uses as magnification. The pt reports she is committed to challenging this unhealthy thinking by staying present and reminding herself of the facts.    Therapeutic Modalities:   Cognitive Behavioral Therapy Motivational Interviewing   Teresita Madura, MSW, [facility] Clinical Social Worker  07/13/2019 2:19 PM

## 2019-07-14 MED ORDER — CLONAZEPAM 0.5 MG PO TABS
0.5000 mg | ORAL_TABLET | Freq: Every day | ORAL | Status: DC
  Administered 2019-07-14 – 2019-07-17 (×3): 0.5 mg via ORAL
  Filled 2019-07-14 (×3): qty 1

## 2019-07-14 MED ORDER — PRENATAL MULTIVITAMIN CH
1.0000 | ORAL_TABLET | Freq: Every day | ORAL | Status: DC
  Administered 2019-07-14 – 2019-07-17 (×4): 1 via ORAL
  Filled 2019-07-14 (×4): qty 1

## 2019-07-14 MED ORDER — MELATONIN 5 MG PO TABS
5.0000 mg | ORAL_TABLET | Freq: Every day | ORAL | Status: DC
  Administered 2019-07-14 – 2019-07-17 (×3): 5 mg via ORAL
  Filled 2019-07-14 (×3): qty 1
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
## 2019-07-14 NOTE — [facility] Group Notes (Signed)
LCSW Group Therapy Notes  Date and Time: 07/14/19 1:00PM  Type of Therapy and Topic: Group Therapy: Healthy Vs. Unhealthy Coping Strategies  Participation Level: [facility] PARTICIPATION LEVEL: Active  Description of Group:  In this group, patients will be encouraged to explore their healthy and unhealthy coping strategics. Coping strategies are actions that we take to deal with stress, problems, or uncomfortable emotions in our daily lives. Each patient will be challenged to read some scenarios and discuss the unhealthy and healthy coping strategies within those scenarios. Also, each patient will be challenged to describe current healthy and unhealthy strategies that they use in their own lives and discuss the outcomes and barriers to those strategies. This group will be process-oriented, with patients participating in exploration of their own experiences as well as giving and receiving support and challenge from other group members.  Therapeutic Goals: 1. Patient will identify personal healthy and unhealthy coping strategies. 2. Patient will identify healthy and unhealthy coping strategies, in others, through scenarios.  3. Patient will identify expected outcomes of healthy and unhealthy coping strategies. 4. Patient will identify barriers to using healthy coping strategies.   Summary of Patient Progress:  The patient identified a current unhealthy coping strategy as self harm. She stated she is committed to focusing on her career and implementing exercising as healthy coping strategies in the future.   Therapeutic Modalities:  Cognitive Behavioral Therapy Solution Focused Therapy Motivational Interviewing   Teresita Madura, MSW, [employer] Clinical Social Worker
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
## 2019-07-14 NOTE — Progress Notes (Addendum)
The CSW started the psychosocial assessment. The patient became inconsolable when discussing her recent breakup with her partner of three years. The CSW was not able to complete the assessment this date.   Teresita Madura, MSW, [employer] Clinical Social Worker

## 2019-07-14 NOTE — Plan of Care (Signed)
D- Patient alert and oriented. Patient presents in a depressed, but pleasant mood on assessment stating that she didn't really sleep last night. Patient stated that she had a headache, however, she did not request any medication from this Clinical research associate. Patient endorsed passive SI, however, she does contract with this Clinical research associate. Patient also endorsed both depression and anxiety, rating them a "9/10" and "8/10". Patient reports that "the breakup", and "I don't know what my ex is doing", is making her feel this way. Patient denies HI/AVH at this time. Patient's goal for today is to "get help with getting over my depression from the breakup", in which she will "see the doctor, talk to my ex", in order to achieve her goal.  A- Scheduled medications administered to patient, per MD orders. Support and encouragement provided.  Routine safety checks conducted every 15 minutes.  Patient informed to notify staff with problems or concerns.  R- No adverse drug reactions noted. Patient contracts for safety at this time. Patient compliant with medications and treatment plan. Patient receptive, calm, and cooperative. Patient interacts well with others on the unit.  Patient remains safe at this time.  Problem: Education: Goal: Knowledge of [place] [facility] Education information/materials will improve Outcome: Not Progressing Goal: Emotional status will improve Outcome: Not Progressing Goal: Mental status will improve Outcome: Not Progressing Goal: Verbalization of understanding the information provided will improve Outcome: Not Progressing   Problem: Activity: Goal: Interest or engagement in activities will improve Outcome: Not Progressing Goal: Sleeping patterns will improve Outcome: Not Progressing   Problem: Coping: Goal: Ability to verbalize frustrations and anger appropriately will improve Outcome: Not Progressing Goal: Ability to demonstrate self-control will improve Outcome: Not Progressing   Problem:  Health Behavior/Discharge Planning: Goal: Identification of resources available to assist in meeting health care needs will improve Outcome: Not Progressing Goal: Compliance with treatment plan for underlying cause of condition will improve Outcome: Not Progressing   Problem: Physical Regulation: Goal: Ability to maintain clinical measurements within normal limits will improve Outcome: Not Progressing   Problem: Safety: Goal: Periods of time without injury will increase Outcome: Not Progressing   Problem: Education: Goal: Utilization of techniques to improve thought processes will improve Outcome: Not Progressing Goal: Knowledge of the prescribed therapeutic regimen will improve Outcome: Not Progressing   Problem: Activity: Goal: Interest or engagement in leisure activities will improve Outcome: Not Progressing Goal: Imbalance in normal sleep/wake cycle will improve Outcome: Not Progressing   Problem: Coping: Goal: Coping ability will improve Outcome: Not Progressing Goal: Will verbalize feelings Outcome: Not Progressing   Problem: Health Behavior/Discharge Planning: Goal: Ability to make decisions will improve Outcome: Not Progressing Goal: Compliance with therapeutic regimen will improve Outcome: Not Progressing   Problem: Role Relationship: Goal: Will demonstrate positive changes in social behaviors and relationships Outcome: Not Progressing   Problem: Safety: Goal: Ability to disclose and discuss suicidal ideas will improve Outcome: Not Progressing Goal: Ability to identify and utilize support systems that promote safety will improve Outcome: Not Progressing   Problem: Self-Concept: Goal: Will verbalize positive feelings about self Outcome: Not Progressing Goal: Level of anxiety will decrease Outcome: Not Progressing

## 2019-07-14 NOTE — Progress Notes (Signed)
Patient was isolative to her room this morning, however, throughout the day, she has been observed attending social work group and actively participated. Patient has also been seen interacting well with other members on the unit, without any issues.

## 2019-07-15 DIAGNOSIS — F332 Major depressive disorder, recurrent severe without psychotic features: Principal | ICD-10-CM

## 2019-07-15 MED ORDER — ENSURE ENLIVE PO LIQD
237.0000 mL | Freq: Two times a day (BID) | ORAL | Status: DC
  Administered 2019-07-17 – 2019-07-18 (×3): 237 mL via ORAL

## 2019-07-15 MED ORDER — DESVENLAFAXINE SUCCINATE ER 50 MG PO TB24
50.0000 mg | ORAL_TABLET | Freq: Two times a day (BID) | ORAL | Status: DC
  Administered 2019-07-15 – 2019-07-17 (×4): 50 mg via ORAL
  Filled 2019-07-15 (×5): qty 1

## 2019-07-15 NOTE — Plan of Care (Signed)
D- Patient alert and oriented. Patient presents in a depressed, but pleasant mood on assessment stating that she slept ok last night and had no complaints to voice to this Clinical research associate. Patient stated that she has chronic back pain, rating it a "3/10", however, she did not request any medication from this Clinical research associate. Patient endorsed both depression and anxiety, stating that "I just feel numb", and that she's noticed the "ticks" that she gets from her anxiety. Patient denies SI, HI, AVH at this time. Patient's goal for today is "coping with breakup, learning to take care of myself alone", in which she will "talk to the professionals if possible, talk to friends/family", in order to achieve her goal.  A- Scheduled medications administered to patient, per MD orders. Support and encouragement provided.  Routine safety checks conducted every 15 minutes.  Patient informed to notify staff with problems or concerns.  R- No adverse drug reactions noted. Patient contracts for safety at this time. Patient compliant with medications and treatment plan. Patient receptive, calm, and cooperative. Patient interacts well with others on the unit.  Patient remains safe at this time.  Problem: Education: Goal: Knowledge of [facility] Education information/materials will improve Outcome: Progressing Goal: Emotional status will improve Outcome: Progressing Goal: Mental status will improve Outcome: Progressing Goal: Verbalization of understanding the information provided will improve Outcome: Progressing   Problem: Activity: Goal: Interest or engagement in activities will improve Outcome: Progressing Goal: Sleeping patterns will improve Outcome: Progressing   Problem: Coping: Goal: Ability to verbalize frustrations and anger appropriately will improve Outcome: Progressing Goal: Ability to demonstrate self-control will improve Outcome: Progressing   Problem: Health Behavior/Discharge Planning: Goal:  Identification of resources available to assist in meeting health care needs will improve Outcome: Progressing Goal: Compliance with treatment plan for underlying cause of condition will improve Outcome: Progressing   Problem: Physical Regulation: Goal: Ability to maintain clinical measurements within normal limits will improve Outcome: Progressing   Problem: Safety: Goal: Periods of time without injury will increase Outcome: Progressing   Problem: Education: Goal: Utilization of techniques to improve thought processes will improve Outcome: Progressing Goal: Knowledge of the prescribed therapeutic regimen will improve Outcome: Progressing   Problem: Activity: Goal: Interest or engagement in leisure activities will improve Outcome: Progressing Goal: Imbalance in normal sleep/wake cycle will improve Outcome: Progressing   Problem: Coping: Goal: Coping ability will improve Outcome: Progressing Goal: Will verbalize feelings Outcome: Progressing   Problem: Health Behavior/Discharge Planning: Goal: Ability to make decisions will improve Outcome: Progressing Goal: Compliance with therapeutic regimen will improve Outcome: Progressing   Problem: Role Relationship: Goal: Will demonstrate positive changes in social behaviors and relationships Outcome: Progressing   Problem: Safety: Goal: Ability to disclose and discuss suicidal ideas will improve Outcome: Progressing Goal: Ability to identify and utilize support systems that promote safety will improve Outcome: Progressing   Problem: Self-Concept: Goal: Will verbalize positive feelings about self Outcome: Progressing Goal: Level of anxiety will decrease Outcome: Progressing

## 2019-07-15 NOTE — [facility] Group Notes (Signed)
[facility] Group Notes:  (Nursing/MHT/Case Management/Adjunct)  Date:  07/15/2019  Time:  9:54 AM  Type of Therapy:  Community Meeting  Participation Level:  Active  Participation Quality:  Appropriate, Attentive and Sharing  Affect:  Appropriate  Cognitive:  Alert and Appropriate  Insight:  Appropriate and Good  Engagement in Group:  Engaged  Modes of Intervention:  Discussion, Education and Support  Summary of Progress/Problems:  Cynthia Castaneda [facility] 07/15/2019, 9:54 AM

## 2019-07-15 NOTE — Progress Notes (Signed)
Recreation Therapy Notes   Date: 07/15/2019  Time: 9:30 am  Location: Craft Room  Behavioral response: Appropriate   Intervention Topic: Coping skills   Discussion/Intervention:  Group content on today was focused on coping skills. The group defined what coping skills are and when they can be used. Individuals described how they normally cope with thing and the coping skills they normally use. Patients expressed why it is important to cope with things and how not coping with things can affect you. The group participated in the intervention "My coping box" and made coping boxes while adding coping skills they could use in the future to the box. Clinical Observations/Feedback:  Patient came to group and was focused on what peers and staff had to say about coping skills. Individual was social with peers and staff while participant in the intervention during group.  Ange Puskas LRT/CTRS         Anitra Doxtater 07/15/2019 1:23 PM

## 2019-07-15 NOTE — Progress Notes (Signed)
Pleasant and brighter affect this evening. She is alert and oriented X4.  Denies SI/HI/AVH during this encounter. Reports that she slept better last night and symptoms are also improving. Patient engaged and social with peers in day room. Patient received prescribed medications and tolerated without incident. Patient informed to notify staff with any concerns. Patient is safe with 15 minute safety checks.

## 2019-07-15 NOTE — [facility] Group Notes (Signed)
LCSW Group Therapy Note  07/15/2019 1:44 PM  Type of Therapy and Topic:  Group Therapy:  Feelings around Relapse and Recovery  Participation Level:  Active   Description of Group:    Patients in this group will discuss emotions they experience before and after a relapse. They will process how experiencing these feelings, or avoidance of experiencing them, relates to having a relapse. Facilitator will guide patients to explore emotions they have related to recovery. Patients will be encouraged to process which emotions are more powerful. They will be guided to discuss the emotional reaction significant others in their lives may have to their relapse or recovery. Patients will be assisted in exploring ways to respond to the emotions of others without this contributing to a relapse.  Therapeutic Goals: 1. Patient will identify two or more emotions that lead to a relapse for them 2. Patient will identify two emotions that result when they relapse 3. Patient will identify two emotions related to recovery 4. Patient will demonstrate ability to communicate their needs through discussion and/or role plays   Summary of Patient Progress: Pt was appropriate and respectful in group. Pt was able to identify her relapse prevention goal as being able to be self-sufficient and not rely on others as much. Pt reported that she wants to find activities that she enjoys independently and learn how to cope with coming out of a relationship.    Therapeutic Modalities:   Cognitive Behavioral Therapy Solution-Focused Therapy Assertiveness Training Relapse Prevention Therapy   Iris Pert, MSW, LCSW Clinical Social Work 07/15/2019 1:44 PM

## 2019-07-15 NOTE — [facility] Suicide Risk Assessment (Signed)
[facility] INPATIENT:  Family/Significant Other Suicide Prevention Education  Suicide Prevention Education:  Education Completed; Cynthia Castaneda, mother, [phone] has been identified by the patient as the family member/significant other with whom the patient will be residing, and identified as the person(s) who will aid the patient in the event of a mental health crisis (suicidal ideations/suicide attempt).  With written consent from the patient, the family member/significant other has been provided the following suicide prevention education, prior to the and/or following the discharge of the patient.  The suicide prevention education provided includes the following:  Suicide risk factors  Suicide prevention and interventions  National Suicide Hotline telephone number  [facility] assessment telephone number  [facility] Emergency Assistance 911  [facility] and/or Residential Mobile Crisis Unit telephone number  Request made of family/significant other to:  Remove weapons (e.g., guns, rifles, knives), all items previously/currently identified as safety concern.    Remove drugs/medications (over-the-counter, prescriptions, illicit drugs), all items previously/currently identified as a safety concern.  The family member/significant other verbalizes understanding of the suicide prevention education information provided.  The family member/significant other agrees to remove the items of safety concern listed above.  CSW spoke with pts mother, Cynthia Castaneda who reported that pt is in the hospital due to suicidal thoughts and was in crisis. Per mother, pt has had severe depression since she was 31-41 years old. Mother expressed no HI or SI and stated that she is on guard all the time and pt knows she can talk to her mother. Mother reported there is a gun at the home, but is secured in a safe and pt does not have access to it. Mother expressed no concerns with pt returning home at  discharge and stated she will be available to pick pt up anytime.    Cynthia Castaneda MSW LCSW 07/15/2019, 2:17 PM
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
## 2019-07-15 NOTE — [facility] Suicide Risk Assessment (Signed)
[facility] INPATIENT:  Family/Significant Other Suicide Prevention Education  Suicide Prevention Education:  Contact Attempts: Shatisha Falter, mother, [phone] has been identified by the patient as the family member/significant other with whom the patient will be residing, and identified as the person(s) who will aid the patient in the event of a mental health crisis.  With written consent from the patient, two attempts were made to provide suicide prevention education, prior to and/or following the patient's discharge.  We were unsuccessful in providing suicide prevention education.  A suicide education pamphlet was given to the patient to share with family/significant other.  Date and time of first attempt: 07/15/19 Date and time of second attempt:  CSW left a HIPAA compliant voicemail requesting a call back.   Charlann Lange Lamaria Hildebrandt MSW LCSW 07/15/2019, 2:15 PM

## 2019-07-15 NOTE — Plan of Care (Signed)
  Problem: Education: Goal: Emotional status will improve Outcome: Progressing Goal: Mental status will improve Outcome: Progressing Goal: Verbalization of understanding the information provided will improve Outcome: Progressing   Problem: Activity: Goal: Interest or engagement in activities will improve Outcome: Progressing Goal: Sleeping patterns will improve Outcome: Progressing   Problem: Coping: Goal: Ability to verbalize frustrations and anger appropriately will improve Outcome: Progressing Goal: Ability to demonstrate self-control will improve Outcome: Progressing   Problem: Health Behavior/Discharge Planning: Goal: Compliance with treatment plan for underlying cause of condition will improve Outcome: Progressing   Problem: Education: Goal: Knowledge of the prescribed therapeutic regimen will improve Outcome: Progressing   Problem: Activity: Goal: Interest or engagement in leisure activities will improve Outcome: Progressing Goal: Imbalance in normal sleep/wake cycle will improve Outcome: Progressing   Problem: Coping: Goal: Coping ability will improve Outcome: Progressing Goal: Will verbalize feelings Outcome: Progressing   Problem: Health Behavior/Discharge Planning: Goal: Ability to make decisions will improve Outcome: Progressing Goal: Compliance with therapeutic regimen will improve Outcome: Progressing   Problem: Safety: Goal: Ability to disclose and discuss suicidal ideas will improve Outcome: Progressing   Problem: Self-Concept: Goal: Will verbalize positive feelings about self Outcome: Progressing Goal: Level of anxiety will decrease Outcome: Progressing
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
## 2019-07-16 NOTE — Plan of Care (Signed)
Patient stated she had a good day and was feeling better tonight  Problem: Education: Goal: Emotional status will improve Outcome: Progressing Goal: Mental status will improve Outcome: Progressing

## 2019-07-16 NOTE — Progress Notes (Signed)
   07/16/19 1400  Clinical Encounter Type  Visited With Patient;Other (Comment)  Visit Type Spiritual support;Social support;Behavioral Health  Referral From Chaplain  Consult/Referral To Chaplain  Chaplain conducted a group on Dealing with your Thoughts and Assumptions and patient participated.  

## 2019-07-16 NOTE — Progress Notes (Signed)
Pt was on the phone upon arrival to the unit. Patient was pleasant during assessment denying SI/HI/AVH with this Clinical research associate. Patient endorses anxiety and depression. Patient compliant with medication administration per MD orders. Pt given education. Patient observed by this Clinical research associate interacting appropriately with staff and peers on the unit. Patient given support and encouragement to be active in her treatment plan. Patient remains safe on the unit.

## 2019-07-16 NOTE — Plan of Care (Signed)
D- Patient alert and oriented. Patient presents in a pleasant mood on assessment stating that she didn't sleep well last night because she didn't get her Melatonin. Patient continues to endorse depression and anxiety, reporting "I still feel kind of numb", and her anxiety is "not as much as yesterday". Overall, patient stated "I'm alright". Patient denies SI, HI, AVH, and pain at this time. Patient's goal for today is to "learn how to be independent", in which she will "talk to a therapist if possible, attend group", in order to achieve her goal.  A- Scheduled medications administered to patient, per MD orders. Support and encouragement provided.  Routine safety checks conducted every 15 minutes.  Patient informed to notify staff with problems or concerns.  R- No adverse drug reactions noted. Patient contracts for safety at this time. Patient compliant with medications and treatment plan. Patient receptive, calm, and cooperative. Patient interacts well with others on the unit.  Patient remains safe at this time.  Problem: Education: Goal: Knowledge of [facility] Education information/materials will improve Outcome: Progressing Goal: Emotional status will improve Outcome: Progressing Goal: Mental status will improve Outcome: Progressing Goal: Verbalization of understanding the information provided will improve Outcome: Progressing   Problem: Activity: Goal: Interest or engagement in activities will improve Outcome: Progressing Goal: Sleeping patterns will improve Outcome: Progressing   Problem: Coping: Goal: Ability to verbalize frustrations and anger appropriately will improve Outcome: Progressing Goal: Ability to demonstrate self-control will improve Outcome: Progressing   Problem: Health Behavior/Discharge Planning: Goal: Identification of resources available to assist in meeting health care needs will improve Outcome: Progressing Goal: Compliance with treatment plan for  underlying cause of condition will improve Outcome: Progressing   Problem: Physical Regulation: Goal: Ability to maintain clinical measurements within normal limits will improve Outcome: Progressing   Problem: Safety: Goal: Periods of time without injury will increase Outcome: Progressing   Problem: Education: Goal: Utilization of techniques to improve thought processes will improve Outcome: Progressing Goal: Knowledge of the prescribed therapeutic regimen will improve Outcome: Progressing   Problem: Activity: Goal: Interest or engagement in leisure activities will improve Outcome: Progressing Goal: Imbalance in normal sleep/wake cycle will improve Outcome: Progressing   Problem: Coping: Goal: Coping ability will improve Outcome: Progressing Goal: Will verbalize feelings Outcome: Progressing   Problem: Health Behavior/Discharge Planning: Goal: Ability to make decisions will improve Outcome: Progressing Goal: Compliance with therapeutic regimen will improve Outcome: Progressing   Problem: Role Relationship: Goal: Will demonstrate positive changes in social behaviors and relationships Outcome: Progressing   Problem: Safety: Goal: Ability to disclose and discuss suicidal ideas will improve Outcome: Progressing Goal: Ability to identify and utilize support systems that promote safety will improve Outcome: Progressing   Problem: Self-Concept: Goal: Will verbalize positive feelings about self Outcome: Progressing Goal: Level of anxiety will decrease Outcome: Progressing

## 2019-07-16 NOTE — Plan of Care (Signed)
Patient endorsing anxiety and depression  Problem: Education: Goal: Emotional status will improve Outcome: Not Progressing Goal: Mental status will improve Outcome: Not Progressing

## 2019-07-16 NOTE — [facility] Group Notes (Signed)
[facility] Group Notes:  (Nursing/MHT/Case Management/Adjunct)  Date:  07/16/2019  Time:  1:09 PM  Type of Therapy:  Community Meeting  Participation Level:  Active  Participation Quality:  Appropriate, Attentive and Sharing  Affect:  Appropriate  Cognitive:  Alert and Appropriate  Insight:  Appropriate  Engagement in Group:  Engaged  Modes of Intervention:  Discussion, Education and Support  Summary of Progress/Problems:  Cynthia Castaneda Cynthia Castaneda 07/16/2019, 1:09 PM

## 2019-07-16 NOTE — Progress Notes (Addendum)
Recreation Therapy Notes   Date: 07/16/2019  Time: 9:30 am  Location: Craft Room  Behavioral response: Appropriate   Intervention Topic: Self-care   Discussion/Intervention:  Group content today was focused on Self-Care. The group defined self-care and some positive ways they care for themselves. Individuals expressed ways and reasons why they neglected any self-care in the past. Patients described ways to improve self-care in the future. The group explained what could happen if they did not do any self-care activities at all. The group participated in the intervention "self-care assessment" where they had a chance to discover some of their weaknesses and strengths in self- care. Patient came up with a self-care plan to improve themselves in the future.  Clinical Observations/Feedback:  Patient came to group and expressed that sometimes she neglects self-care because she does not values herself.She explained that she participates in self-care by prioritizing things. Individual was social with peers and staff while participant in the intervention during group.  Auther Lyerly LRT/CTRS         Cynthia Castaneda 07/16/2019 10:51 AM
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
## 2019-07-16 NOTE — Progress Notes (Signed)
Pt was on the phone upon arrival to the unit. Patient was pleasant during assessment denying SI/HI/AVH with this Clinical research associate. Patient endorses anxiety and depression. Patient refused medications this evening. Pt given education. Patient observed by this Clinical research associate interacting appropriately with staff and peers on the unit. Patient given support and encouragement to be active in her treatment plan. Patient remains safe on the unit.

## 2019-07-16 NOTE — Progress Notes (Signed)
Recreation Therapy Notes  INPATIENT RECREATION THERAPY ASSESSMENT  Patient Details Name: Cynthia Castaneda MRN: [id number] DOB: [birth year] Today's Date: 07/16/2019       Information Obtained From: Patient  Able to Participate in Assessment/Interview: Yes  Patient Presentation: Responsive  Reason for Admission (Per Patient): Active Symptoms, Suicidal Ideation  Patient Stressors: Relationship  Coping Skills:   Talk, Other (Comment)(pet my cat)  Leisure Interests (2+):  Games - Video games  Frequency of Recreation/Participation:    Awareness of Community Resources:     [employer]:     Current Use:    If no, Barriers?:    Expressed Interest in [employer] Information:    [employer] of Residence:  Physicist, medical  Patient Main Form of Transportation: Other (Comment)(My mom)  Patient Strengths:  Helping  Patient Identified Areas of Improvement:  Be more independent  Patient Goal for Hospitalization:  Learn how to value myself and take care of myself  Current SI (including self-harm):  No  Current HI:  No  Current AVH: No  Staff Intervention Plan: Group Attendance, Collaborate with Interdisciplinary Treatment Team  Consent to Intern Participation: N/A  Cynthia Castaneda 07/16/2019, 2:16 PM

## 2019-07-16 NOTE — Tx Team (Addendum)
Interdisciplinary Treatment and Diagnostic Plan Update  07/16/2019 Time of Session: 9am Cynthia Castaneda MRN: [id number]  Principal Diagnosis: Major depressive disorder, recurrent severe without psychotic features (HCC)  Secondary Diagnoses: Principal Problem:   Major depressive disorder, recurrent severe without psychotic features (HCC) Active Problems:   Chronic post-traumatic stress disorder   Obsessive compulsive disorder   Avoidant-restrictive food intake disorder (ARFID)   Current Medications:  Current Facility-Administered Medications  Medication Dose Route Frequency Provider Last Rate Last Admin  . acetaminophen (TYLENOL) tablet 650 mg  650 mg Oral Q6H PRN Charm Rings, NP      . alum & mag hydroxide-simeth (MAALOX/MYLANTA) 200-200-20 MG/5ML suspension 30 mL  30 mL Oral Q4H PRN Charm Rings, NP      . clonazePAM Scarlette Calico) tablet 0.5 mg  0.5 mg Oral QHS Malvin Johns, MD   0.5 mg at 07/14/19 2131  . desvenlafaxine (PRISTIQ) 24 hr tablet 50 mg  50 mg Oral BID Clapacs, Jackquline Denmark, MD   50 mg at 07/16/19 0920  . feeding supplement (ENSURE ENLIVE) (ENSURE ENLIVE) liquid 237 mL  237 mL Oral BID BM Clapacs, John T, MD      . magnesium hydroxide (MILK OF MAGNESIA) suspension 30 mL  30 mL Oral Daily PRN Charm Rings, NP      . melatonin tablet 5 mg  5 mg Oral QHS Malvin Johns, MD   5 mg at 07/14/19 2131  . prenatal multivitamin tablet 1 tablet  1 tablet Oral Q1200 Malvin Johns, MD   1 tablet at 07/15/19 1349   PTA Medications: Medications Prior to Admission  Medication Sig Dispense Refill Last Dose  . desvenlafaxine (PRISTIQ) 50 MG 24 hr tablet Take 1 tablet (50 mg total) by mouth daily after supper. 30 tablet 3 07/12/2019 at Unknown time    Patient Stressors: Loss of boyfriend Medication change or noncompliance  Patient Strengths: Average or above average intelligence Capable of independent living Communication skills General fund of knowledge  Treatment Modalities:  Medication Management, Group therapy, Case management,  1 to 1 session with clinician, Psychoeducation, Recreational therapy.   Physician Treatment Plan for Primary Diagnosis: Major depressive disorder, recurrent severe without psychotic features (HCC) Long Term Goal(s): Improvement in symptoms so as ready for discharge Improvement in symptoms so as ready for discharge   Short Term Goals: Ability to identify changes in lifestyle to reduce recurrence of condition will improve Ability to verbalize feelings will improve Ability to disclose and discuss suicidal ideas Ability to demonstrate self-control will improve Ability to identify and develop effective coping behaviors will improve Ability to maintain clinical measurements within normal limits will improve Compliance with prescribed medications will improve Ability to identify triggers associated with substance abuse/mental health issues will improve Ability to identify changes in lifestyle to reduce recurrence of condition will improve Ability to verbalize feelings will improve Ability to disclose and discuss suicidal ideas Ability to demonstrate self-control will improve Ability to identify and develop effective coping behaviors will improve  Medication Management: Evaluate patient's response, side effects, and tolerance of medication regimen.  Therapeutic Interventions: 1 to 1 sessions, Unit Group sessions and Medication administration.  Evaluation of Outcomes: Progressing  Physician Treatment Plan for Secondary Diagnosis: Principal Problem:   Major depressive disorder, recurrent severe without psychotic features (HCC) Active Problems:   Chronic post-traumatic stress disorder   Obsessive compulsive disorder   Avoidant-restrictive food intake disorder (ARFID)  Long Term Goal(s): Improvement in symptoms so as ready for discharge Improvement in symptoms  so as ready for discharge   Short Term Goals: Ability to identify changes in  lifestyle to reduce recurrence of condition will improve Ability to verbalize feelings will improve Ability to disclose and discuss suicidal ideas Ability to demonstrate self-control will improve Ability to identify and develop effective coping behaviors will improve Ability to maintain clinical measurements within normal limits will improve Compliance with prescribed medications will improve Ability to identify triggers associated with substance abuse/mental health issues will improve Ability to identify changes in lifestyle to reduce recurrence of condition will improve Ability to verbalize feelings will improve Ability to disclose and discuss suicidal ideas Ability to demonstrate self-control will improve Ability to identify and develop effective coping behaviors will improve     Medication Management: Evaluate patient's response, side effects, and tolerance of medication regimen.  Therapeutic Interventions: 1 to 1 sessions, Unit Group sessions and Medication administration.  Evaluation of Outcomes: Progressing   RN Treatment Plan for Primary Diagnosis: Major depressive disorder, recurrent severe without psychotic features ([place]) Long Term Goal(s): Knowledge of disease and therapeutic regimen to maintain health will improve  Short Term Goals: Ability to participate in decision making will improve, Ability to verbalize feelings will improve, Ability to disclose and discuss suicidal ideas, Ability to identify and develop effective coping behaviors will improve and Compliance with prescribed medications will improve  Medication Management: RN will administer medications as ordered by provider, will assess and evaluate patient's response and provide education to patient for prescribed medication. RN will report any adverse and/or side effects to prescribing provider.  Therapeutic Interventions: 1 on 1 counseling sessions, Psychoeducation, Medication administration, Evaluate responses to  treatment, Monitor vital signs and CBGs as ordered, Perform/monitor CIWA, COWS, AIMS and Fall Risk screenings as ordered, Perform wound care treatments as ordered.  Evaluation of Outcomes: Progressing   LCSW Treatment Plan for Primary Diagnosis: Major depressive disorder, recurrent severe without psychotic features ([place]) Long Term Goal(s): Safe transition to appropriate next level of care at discharge, Engage patient in therapeutic group addressing interpersonal concerns.  Short Term Goals: Engage patient in aftercare planning with referrals and resources  Therapeutic Interventions: Assess for all discharge needs, 1 to 1 time with Social worker, Explore available resources and support systems, Assess for adequacy in community support network, Educate family and significant other(s) on suicide prevention, Complete Psychosocial Assessment, Interpersonal group therapy.  Evaluation of Outcomes: Progressing   Progress in Treatment: Attending groups: Yes. Participating in groups: Yes. Taking medication as prescribed: Yes. Toleration medication: Yes. Family/Significant other contact made: Yes, individual(s) contacted:  pt's mother Patient understands diagnosis: Yes. Discussing patient identified problems/goals with staff: Yes. Medical problems stabilized or resolved: No. Denies suicidal/homicidal ideation: Yes. Issues/concerns per patient self-inventory: No. Other: NA  New problem(s) identified: No, Describe:  None reported  New Short Term/Long Term Goal(s):Attend outpatient treatment, take medication as prescribed, develop and implement healthy coping methods  Patient Goals:  "Be able to feel safe and independent"  Discharge Plan or Barriers: Pt will will return home and follow up at [facility].  Reason for Continuation of Hospitalization: Depression Medication stabilization Suicidal ideation  Estimated Length of Stay:1-7 days  Recreational  Therapy: Patient Stressors: N/A Patient Goal: Patient will engage in groups without prompting or encouragement from LRT x3 group sessions within 5 recreation therapy group sessions  Attendees: Patient:Cynthia Castaneda 07/16/2019 11:45 AM  Physician: Alethia Berthold 07/16/2019 11:45 AM  Nursing: Collier Bullock, Demetria Ravenell 07/16/2019 11:45 AM  RN Care Manager: 07/16/2019 11:45 AM  Social Worker: Darren [employer] 07/16/2019 11:45 AM  Recreational Therapist: Garret Reddish 07/16/2019 11:45 AM  Other:  07/16/2019 11:45 AM  Other:  07/16/2019 11:45 AM  Other: 07/16/2019 11:45 AM    Scribe for Treatment Team: Suzan Slick, LCSW 07/16/2019 11:45 AM

## 2019-07-17 DIAGNOSIS — F332 Major depressive disorder, recurrent severe without psychotic features: Principal | ICD-10-CM

## 2019-07-17 MED ORDER — DULOXETINE HCL 30 MG PO CPEP
60.0000 mg | ORAL_CAPSULE | Freq: Every day | ORAL | Status: DC
  Administered 2019-07-18: 09:00:00 60 mg via ORAL
  Filled 2019-07-17 (×2): qty 2

## 2019-07-17 NOTE — [facility] Group Notes (Signed)
[facility] Group Notes:  (Nursing/MHT/Case Management/Adjunct)  Date:  07/17/2019  Time:  3:28 AM  Type of Therapy:  Group Therapy    Participation Level:  Active  Participation Quality:  Appropriate, Sharing and Supportive  Affect:  Appropriate  Cognitive:  Appropriate  Insight:  Appropriate  Engagement in Group:  Engaged and Supportive  Modes of Intervention:  Discussion, Education and Support  Summary of Progress/Problems: PT stated that she wants to get better by seeing a therapist, and do better with taking her meds  Cynthia Castaneda 07/17/2019, 3:28 AM

## 2019-07-17 NOTE — Progress Notes (Signed)
[facility] MD Progress Note  07/17/2019 1:43 PM Cynthia Castaneda  MRN:  [id number]   Subjective: Follow-up for this 24 year old female diagnosed with major depressive disorder recurrent.  Patient states that she feels that she has been doing a little bit better.  She states that last night she started having a lot of anxiety about speaking with her ex-boyfriend.  She stated that she felt like she decided to call him just to hear his voice 1 more time before she went to bed and ended up calling him approximately 10 times.  She reports that she feels that she needs to remain in the hospital until she learns to be more dependent and be less codependent on her boyfriend.  She reports that they have been together for approximately 3 years and he broke up with her impulsively, but she also reports that he has PTSD and deals with a lot of stress from being in the relationship.  Patient reports that the Pristiq is doing okay but she would prefer to be back on Cymbalta because it was her "fix all drug."  She reports that it helped decrease her intrusive thoughts.  She states that she has obsessive-compulsive disorder and that her intrusive thoughts become more severe when she gets into a bad situation such as with her ex-boyfriend when he broke up with her.  She reports that she could not stop thinking about him breaking up with her because he was cheating on her and that this made her really feel that he was cheating on her which she reports as a delusional thought.  Patient states that she was told this by her outpatient provider.  She also reports that she has done research on her DNA and realizes all the issues that she has with mental health.  She reports that she had DNA testing done an outpatient for mental health and then she progressed her on research on her DNA and found that she has multiple mental health issues that her self diagnosed.  Principal Problem: Major depressive disorder, recurrent severe without  psychotic features (HCC) Diagnosis: Principal Problem:   Major depressive disorder, recurrent severe without psychotic features (HCC) Active Problems:   Chronic post-traumatic stress disorder   Obsessive compulsive disorder   Avoidant-restrictive food intake disorder (ARFID)  Total Time spent with patient: 30 minutes  Past Psychiatric History: No prior inpatient care however does have outpatient psychiatric and therapy through [facility] and past medication trials of sertraline and fluoxetine  Past Medical History:  Past Medical History:  Diagnosis Date  . ADHD (attention deficit hyperactivity disorder)   . Allergy   . Anxiety   . Borderline personality disorder (HCC)   . Depression   . Eating disorder   . Frequent headaches   . Migraine   . OCD (obsessive compulsive disorder)   . PTSD (post-traumatic stress disorder)    History reviewed. No pertinent surgical history. Family History:  Family History  Problem Relation Age of Onset  . Depression Mother   . Mental illness Mother   . Depression Father   . Mental illness Father   . Depression Brother   . Mental illness Brother   . Bladder Cancer Neg Hx   . Kidney cancer Neg Hx    Family Psychiatric  History:  Reports depressive disorders on her father's side of the family Social History:  Social History   Substance and Sexual Activity  Alcohol Use Not Currently     Social History   Substance and Sexual  Activity  Drug Use No    Social History   Socioeconomic History  . Marital status: Single    Spouse name: Not on file  . Number of children: Not on file  . Years of education: Not on file  . Highest education level: Not on file  Occupational History  . Not on file  Tobacco Use  . Smoking status: Never Smoker  . Smokeless tobacco: Never Used  Substance and Sexual Activity  . Alcohol use: Not Currently  . Drug use: No  . Sexual activity: Not on file  Other Topics Concern  . Not on file  Social History  Narrative  . Not on file   Social Determinants of Health   Financial Resource Strain:   . Difficulty of Paying Living Expenses:   Food Insecurity:   . Worried About Programme researcher, broadcasting/film/video in the Last Year:   . Barista in the Last Year:   Transportation Needs:   . Freight forwarder (Medical):   Marland Kitchen Lack of Transportation (Non-Medical):   Physical Activity:   . Days of Exercise per Week:   . Minutes of Exercise per Session:   Stress:   . Feeling of Stress :   Social Connections:   . Frequency of Communication with Friends and Family:   . Frequency of Social Gatherings with Friends and Family:   . Attends Religious Services:   . Active Member of Clubs or Organizations:   . Attends Banker Meetings:   Marland Kitchen Marital Status:    Additional Social History:                         Sleep: Good  Appetite:  Good  Current Medications: Current Facility-Administered Medications  Medication Dose Route Frequency Provider Last Rate Last Admin  . acetaminophen (TYLENOL) tablet 650 mg  650 mg Oral Q6H PRN Charm Rings, NP      . alum & mag hydroxide-simeth (MAALOX/MYLANTA) 200-200-20 MG/5ML suspension 30 mL  30 mL Oral Q4H PRN Charm Rings, NP      . clonazePAM Scarlette Calico) tablet 0.5 mg  0.5 mg Oral QHS Malvin Johns, MD   0.5 mg at 07/16/19 2114  . [START ON 07/18/2019] DULoxetine (CYMBALTA) DR capsule 60 mg  60 mg Oral Daily Zakyria Metzinger, Gerlene Burdock, FNP      . feeding supplement (ENSURE ENLIVE) (ENSURE ENLIVE) liquid 237 mL  237 mL Oral BID BM Clapacs, John T, MD   237 mL at 07/17/19 1034  . magnesium hydroxide (MILK OF MAGNESIA) suspension 30 mL  30 mL Oral Daily PRN Charm Rings, NP      . melatonin tablet 5 mg  5 mg Oral QHS Malvin Johns, MD   5 mg at 07/16/19 2114  . prenatal multivitamin tablet 1 tablet  1 tablet Oral Q1200 Malvin Johns, MD   1 tablet at 07/17/19 1234    Lab Results: No results found for this or any previous visit (from the past 48  hour(s)).  Blood Alcohol level:  No results found for: [facility]  Metabolic Disorder Labs: No results found for: HGBA1C, MPG No results found for: PROLACTIN No results found for: CHOL, TRIG, HDL, CHOLHDL, VLDL, LDLCALC  Physical Findings: AIMS: Facial and Oral Movements Muscles of Facial Expression: None, normal Lips and Perioral Area: None, normal Jaw: None, normal Tongue: None, normal,Extremity Movements Upper (arms, wrists, hands, fingers): None, normal Lower (legs, knees, ankles, toes): None, normal,  Trunk Movements Neck, shoulders, hips: None, normal, Overall Severity Severity of abnormal movements (highest score from questions above): None, normal Incapacitation due to abnormal movements: None, normal Patient's awareness of abnormal movements (rate only patient's report): No Awareness, Dental Status Current problems with teeth and/or dentures?: No Does patient usually wear dentures?: No  CIWA:    COWS:     Musculoskeletal: Strength & Muscle Tone: within normal limits Gait & Station: normal Patient leans: N/A  Psychiatric Specialty Exam: Physical Exam  Nursing note and vitals reviewed. Constitutional: She is oriented to person, place, and time. She appears well-developed and well-nourished.  Cardiovascular: Normal rate.  Respiratory: Effort normal.  Musculoskeletal:        General: Normal range of motion.  Neurological: She is alert and oriented to person, place, and time.  Skin: Skin is warm.  Psychiatric: Her mood appears anxious.    Review of Systems  Constitutional: Negative.   HENT: Negative.   Eyes: Negative.   Respiratory: Negative.   Cardiovascular: Negative.   Gastrointestinal: Negative.   Genitourinary: Negative.   Musculoskeletal: Negative.   Skin: Negative.   Neurological: Negative.   Psychiatric/Behavioral: The patient is nervous/anxious.     Blood pressure 133/78, pulse 83, temperature 98.2 F (36.8 C), temperature source Oral, resp. rate 17,  height 5\' 3"  (1.6 m), weight 49.4 kg, SpO2 100 %.Body mass index is 19.31 kg/m.  General Appearance: Casual  Eye Contact:  Good  Speech:  Clear and Coherent and Normal Rate  Volume:  Decreased  Mood:  Anxious  Affect:  Congruent  Thought Process:  Coherent and Descriptions of Associations: Intact  Orientation:  Full (Time, Place, and Person)  Thought Content:  WDL  Suicidal Thoughts:  No  Homicidal Thoughts:  No  Memory:  Immediate;   Fair Recent;   Fair Remote;   Fair  Judgement:  Fair  Insight:  Fair  Psychomotor Activity:  Normal  Concentration:  Concentration: Fair  Recall:  of Knowledge:  Fair  Language:  Fair  Akathisia:  No  Handed:  Right  AIMS (if indicated):     Assets:  Communication Skills Desire for Improvement Financial Resources/Insurance Housing Physical Health Social Support Transportation  ADL's:  Intact  Cognition:  WNL  Sleep:  Number of Hours: 7.5   Assessment: Patient presents in her room and presents calm and talkative.  When discussing her issues with her boyfriend has discussed that she may need some significant therapy to help her learn coping skills and becoming more dependent and not so codependent on her boyfriend and that a relationship may not be the best thing at this time, but immediately after that the patient started bouncing her leg and appeared anxious.  Patient was easily redirected to talking about medications and discussing her Cymbalta.  Patient has agreed to switch back to Cymbalta and after consulting with Dr. [employer] will have the patient take it with some chocolate pudding.  She states that the only reason she stopped taking Cymbalta was because she was having difficulty swallowing it and it was getting stuck in her throat.  Patient has not tried taking it with food to see if that would resolve the issue.  Patient does present with some borderline personality tendencies.  We will discontinue Pristiq and start patient on  Cymbalta 60 mg p.o. daily starting tomorrow since she is already had the Pristiq today and ordered her to take it with the chocolate pudding.  Treatment Plan Summary: Daily contact with  patient to assess and evaluate symptoms and progress in treatment and Medication management Discontinue Pristiq Start Cymbalta 60 mg p.o. daily for MDD and take with chocolate pudding Continue melatonin 5 mg p.o. nightly for sleep Continue Klonopin 0.5 mg p.o. nightly for sleep Encourage group therapy participation Continue every 15 minute safety checks  Lewis Shock, FNP 07/17/2019, 1:43 PM

## 2019-07-17 NOTE — Progress Notes (Addendum)
Client rated anxiety 6/10, "because of her OCD"  and depression 7/10 "because of her breakup" denies SI, HI, AVH. Client c/o pf 4/10 back pain but refused PRN tylenol.  Client stated last BM on 03/30 with some difficulty due to IBS. Client was seen on phone for having multiple crying spells. Writer offered  assistance and repeatedly declined. After the call was over, client informed writer she was on the phone with ex boyfriend who wants her to come home. Client asked to sign discharge paperwork as if she were VC. Informed client she was IVC and would have to be d/c by M.D. Continued monitoring for client. Safety measures in place and effective.

## 2019-07-17 NOTE — Progress Notes (Signed)
Recreation Therapy Notes   Date: 07/17/2019  Time: 9:30 am  Location: Craft Room  Behavioral response: Appropriate   Intervention Topic: Problem-Solving    Discussion/Intervention:  Group content on today was focused on problem solving. The group described what problem solving is. Patients expressed how problems affect them and how they deal with problems. Individuals identified healthy ways to deal with problems. Patients explained what normally happens to them when they do not deal with problems. The group expressed reoccurring problems for them. The group participated in the intervention "Ways to Solve problems" where patients were given a chance to explore different ways to solve problems.  Clinical Observations/Feedback:  Patient came to group and stated she normally acts on impulse or self-harms when she has to deal with a difficult problem. She expressed that she normally does not ask for help when she has a difficult problem because she feels like she is bothering others. Individual was social with peers and staff while participant in the intervention during group.  Cynthia Castaneda LRT/CTRS         [name] 07/17/2019 11:37 AM

## 2019-07-17 NOTE — BHH Group Notes (Signed)
Balance In Life 07/17/2019 1PM  Type of Therapy/Topic:  Group Therapy:  Balance in Life  Participation Level:  Active  Description of Group:   This group will address the concept of balance and how it feels and looks when one is unbalanced. Patients will be encouraged to process areas in their lives that are out of balance and identify reasons for remaining unbalanced. Facilitators will guide patients in utilizing problem-solving interventions to address and correct the stressor making their life unbalanced. Understanding and applying boundaries will be explored and addressed for obtaining and maintaining a balanced life. Patients will be encouraged to explore ways to assertively make their unbalanced needs known to significant others in their lives, using other group members and facilitator for support and feedback.  Therapeutic Goals: 1. Patient will identify two or more emotions or situations they have that consume much of in their lives. 2. Patient will identify signs/triggers that life has become out of balance:  3. Patient will identify two ways to set boundaries in order to achieve balance in their lives:  4. Patient will demonstrate ability to communicate their needs through discussion and/or role plays  Summary of Patient Progress: Actively and appropriately participated in todays session. Pt reports when she is experiencing depressive symtopms, she neglects her hygiene. Pt identifies improving hygiene as an area she would like to improve. Pt interacted appropriately with group members and respected boundaries during session.   Therapeutic Modalities:   Cognitive Behavioral Therapy Solution-Focused Therapy Assertiveness Training  Mariacristina Aday Philip Aspen, LCSW

## 2019-07-17 NOTE — Plan of Care (Signed)
Patient rated her depression 7/10 and anxiety 8/10.Patient stated that her goal is to be an independent person and to have myself.Patient is appropriate in the milieu.Denies SI,HI and AVH.Compliant with medications.Appetite and energy level good.Attended groups.Support and encouragement given.

## 2019-07-17 NOTE — BHH Group Notes (Signed)
BHH Group Notes:  (Nursing/MHT/Case Management/Adjunct)  Date:  07/17/2019  Time:  8:51 PM  Type of Therapy:  Wrap-Up Group  Participation Level:  Active  Participation Quality:  Appropriate  Affect:  Appropriate  Cognitive:  Appropriate  Insight:  Appropriate and Good  Engagement in Group:  Engaged  Modes of Intervention:  Clarification  Summary of Progress/Problems:  Cynthia Castaneda 07/17/2019, 8:51 PM

## 2019-07-18 MED ORDER — CLONAZEPAM 0.5 MG PO TABS: 0.5000 mg | ORAL_TABLET | Freq: Every day | ORAL | 0 refills | Status: DC

## 2019-07-18 MED ORDER — MELATONIN 5 MG PO TABS: 5.0000 mg | ORAL_TABLET | Freq: Every day | ORAL | 1 refills | Status: DC

## 2019-07-18 MED ORDER — DULOXETINE HCL 60 MG PO CPEP: 60.0000 mg | ORAL_CAPSULE | Freq: Every day | ORAL | 1 refills | Status: DC

## 2019-07-18 NOTE — Plan of Care (Signed)
  Problem: Self-Esteem Goal: STG - Patient will identify 3 positive ways of expressing themselves within 5 recreation therapy group sessions Description: STG - Patient will identify 3 positive ways of expressing themselves within 5 recreation therapy group sessions Outcome: Completed/Met

## 2019-07-18 NOTE — BHH Suicide Risk Assessment (Signed)
[facility] Discharge Suicide Risk Assessment   Principal Problem: Major depressive disorder, recurrent severe without psychotic features (HCC) Discharge Diagnoses: Principal Problem:   Major depressive disorder, recurrent severe without psychotic features (HCC) Active Problems:   Chronic post-traumatic stress disorder   Obsessive compulsive disorder   Avoidant-restrictive food intake disorder (ARFID)   Total Time spent with patient: 30 minutes  Musculoskeletal: Strength & Muscle Tone: within normal limits Gait & Station: normal Patient leans: N/A  Psychiatric Specialty Exam: Review of Systems  Constitutional: Negative.   HENT: Negative.   Eyes: Negative.   Respiratory: Negative.   Cardiovascular: Negative.   Gastrointestinal: Negative.   Musculoskeletal: Negative.   Skin: Negative.   Neurological: Negative.   Psychiatric/Behavioral: Negative.     Blood pressure 125/82, pulse 84, temperature 98.2 F (36.8 C), temperature source Oral, resp. rate 17, height 5\' 3"  (1.6 m), weight 49.4 kg, SpO2 100 %.Body mass index is 19.31 kg/m.  General Appearance: Fairly Groomed  ::  Good  Speech:  Clear and Coherent409  Volume:  Normal  Mood:  Euthymic  Affect:  Congruent  Thought Process:  Coherent  Orientation:  Full (Time, Place, and Person)  Thought Content:  Logical  Suicidal Thoughts:  No  Homicidal Thoughts:  No  Memory:  Immediate;   Fair Recent;   Fair Remote;   Fair  Judgement:  Fair  Insight:  Fair  Psychomotor Activity:  Normal  Concentration:  Fair  Recall:  [id number] of Knowledge:Fair  Language: Fair  Akathisia:  No  Handed:  Right  AIMS (if indicated):     Assets:  Communication Skills Desire for Improvement Housing Physical Health Resilience Social Support  Sleep:  Number of Hours: 7.5  Cognition: WNL  ADL's:  Intact   Mental Status Per Nursing Assessment::   On Admission:  NA  Demographic Factors:  Adolescent or young adult and  Caucasian  Loss Factors: NA  Historical Factors: Impulsivity  Risk Reduction Factors:   Living with another person, especially a relative, Positive social support and Positive therapeutic relationship  Continued Clinical Symptoms:  Depression:   Impulsivity  Cognitive Features That Contribute To Risk:  None    Suicide Risk:  Minimal: No identifiable suicidal ideation.  Patients presenting with no risk factors but with morbid ruminations; may be classified as minimal risk based on the severity of the depressive symptoms  Follow-up Information    [facility] Follow up.   Specialty: Behavioral Health Why: You have an appointment scheduled with Whitney on 07/22/19 @ 2:30 PM via WebEx. Thank You! Contact information: [address] [facility] [place] [phone]       [facility] Follow up on 07/24/2019.   Specialty: Behavioral Health Why: You have an appointment scheduled with Dr. 09/23/2019 on 07/24/19 at 9:20AM. This appointment will be in person. Please take list of medications and discharge summery with you. Thank You! Contact information: [address] [place] [phone]           Plan Of Care/Follow-up recommendations:  Activity:  Activity as tolerated Diet:  Regular diet Other:  Follow-up with outpatient treatment with psychiatrist and therapist  [phone], MD 07/18/2019, 10:29 AM

## 2019-07-18 NOTE — Progress Notes (Addendum)
  [facility] Adult Case Management Discharge Plan :  Will you be returning to the same living situation after discharge:  Yes,  home At discharge, do you have transportation home?: Yes,  pts mother will pick her up Do you have the ability to pay for your medications: Yes,  [employer]  Release of information consent forms completed and in the chart;    Patient to Follow up at: Follow-up Information    [facility] Follow up.   Specialty: Behavioral Health Why: You have an appointment scheduled with Whitney on 07/23/19 @ 2:30 PM via WebEx. Thank You! Contact information: [address] [facility] [place] [address] [phone]       [facility] Follow up on 07/24/2019.   Specialty: Behavioral Health Why: You have an appointment scheduled with Dr. Marlyne Beards on 07/24/19 at 9:20AM. This appointment will be in person. Please take list of medications and discharge summery with you. Thank You! Contact information: [address] [place] [address] [phone]           Next level of care provider has access to [facility] Link:yes  Safety Planning and Suicide Prevention discussed: Yes,  SPE completed with pts mother  Have you used any form of tobacco in the last 30 days? (Cigarettes, Smokeless Tobacco, Cigars, and/or Pipes): No  Has patient been referred to the Quitline?: Patient refused referral  Patient has been referred for addiction treatment: N/A  Mechele Dawley, LCSW 07/18/2019, 11:37 AM

## 2019-07-18 NOTE — Discharge Summary (Signed)
Physician Discharge Summary Note  Patient:  Cynthia Castaneda is an 24 y.o., female MRN:  [id number] DOB:  1995/07/10 Patient phone:  [phone] (home)  Patient address:   [place] [address],  Total Time spent with patient: 30 minutes  Date of Admission:  07/13/2019 Date of Discharge: July 18, 2019  Reason for Admission: Admitted because of worsening depression and anxiety with suicidal ideation  Principal Problem: Major depressive disorder, recurrent severe without psychotic features [facility]) Discharge Diagnoses: Principal Problem:   Major depressive disorder, recurrent severe without psychotic features ([place]) Active Problems:   Chronic post-traumatic stress disorder   Obsessive compulsive disorder   Avoidant-restrictive food intake disorder (ARFID)   Past Psychiatric History: Past history of PTSD and depression with previous good response with outpatient treatment  Past Medical History:  Past Medical History:  Diagnosis Date  . ADHD (attention deficit hyperactivity disorder)   . Allergy   . Anxiety   . Borderline personality disorder ([place])   . Depression   . Eating disorder   . Frequent headaches   . Migraine   . OCD (obsessive compulsive disorder)   . PTSD (post-traumatic stress disorder)    History reviewed. No pertinent surgical history. Family History:  Family History  Problem Relation Age of Onset  . Depression Mother   . Mental illness Mother   . Depression Father   . Mental illness Father   . Depression Brother   . Mental illness Brother   . Bladder Cancer Neg Hx   . Kidney cancer Neg Hx    Family Psychiatric  History: See previous notes some anxiety. Social History:  Social History   Substance and Sexual Activity  Alcohol Use Not Currently     Social History   Substance and Sexual Activity  Drug Use No    Social History   Socioeconomic History  . Marital status: Single    Spouse name: Not on file  . Number of children: Not  on file  . Years of education: Not on file  . Highest education level: Not on file  Occupational History  . Not on file  Tobacco Use  . Smoking status: Never Smoker  . Smokeless tobacco: Never Used  Substance and Sexual Activity  . Alcohol use: Not Currently  . Drug use: No  . Sexual activity: Not on file  Other Topics Concern  . Not on file  Social History Narrative  . Not on file   Social Determinants of Health   Financial Resource Strain:   . Difficulty of Paying Living Expenses:   Food Insecurity:   . Worried About Charity fundraiser in the Last Year:   . Arboriculturist in the Last Year:   Transportation Needs:   . Film/video editor (Medical):   Marland Kitchen Lack of Transportation (Non-Medical):   Physical Activity:   . Days of Exercise per Week:   . Minutes of Exercise per Session:   Stress:   . Feeling of Stress :   Social Connections:   . Frequency of Communication with Friends and Family:   . Frequency of Social Gatherings with Friends and Family:   . Attends Religious Services:   . Active Member of Clubs or Organizations:   . Attends Archivist Meetings:   Marland Kitchen Marital Status:     Hospital Course: 15-minute checks maintained.  Patient did not display any dangerous aggressive or violent behaviors in the hospital.  She was involved in individual  assessment met with her treatment team and attended group and individual counseling.  Patient's medications were adjusted and ultimately we switched her back to her previous Cymbalta with instructions that she can open the capsule up and dump out the powder if she is finding it difficult to swallow it.  She is also on clonazepam at night but plans to try to taper that off.  Patient says she is not having hallucinations not having suicidal thoughts and feels more stable.  She will be staying with her family at discharge and following up with outpatient psychiatry and therapy.  Physical Findings: AIMS: Facial and Oral  Movements Muscles of Facial Expression: None, normal Lips and Perioral Area: None, normal Jaw: None, normal Tongue: None, normal,Extremity Movements Upper (arms, wrists, hands, fingers): None, normal Lower (legs, knees, ankles, toes): None, normal, Trunk Movements Neck, shoulders, hips: None, normal, Overall Severity Severity of abnormal movements (highest score from questions above): None, normal Incapacitation due to abnormal movements: None, normal Patient's awareness of abnormal movements (rate only patient's report): No Awareness, Dental Status Current problems with teeth and/or dentures?: No Does patient usually wear dentures?: No  CIWA:    COWS:     Musculoskeletal: Strength & Muscle Tone: within normal limits Gait & Station: normal Patient leans: N/A  Psychiatric Specialty Exam: Physical Exam  Nursing note and vitals reviewed. Constitutional: She appears well-developed and well-nourished.  HENT:  Head: Normocephalic and atraumatic.  Eyes: Pupils are equal, round, and reactive to light. Conjunctivae are normal.  Cardiovascular: Normal heart sounds.  Respiratory: Effort normal.  GI: Soft.  Musculoskeletal:        General: Normal range of motion.     Cervical back: Normal range of motion.  Neurological: She is alert.  Skin: Skin is warm and dry.  Psychiatric: She has a normal mood and affect. Her speech is normal and behavior is normal. Judgment and thought content normal. Cognition and memory are normal.    Review of Systems  Constitutional: Negative.   HENT: Negative.   Eyes: Negative.   Respiratory: Negative.   Cardiovascular: Negative.   Gastrointestinal: Negative.   Musculoskeletal: Negative.   Skin: Negative.   Neurological: Negative.   Psychiatric/Behavioral: Negative.     Blood pressure 125/82, pulse 84, temperature 98.2 F (36.8 C), temperature source Oral, resp. rate 17, height 5' 3"  (1.6 m), weight 49.4 kg, SpO2 100 %.Body mass index is 19.31 kg/m.   General Appearance: Casual  Eye Contact:  Good  Speech:  Clear and Coherent  Volume:  Normal  Mood:  Euthymic  Affect:  Congruent  Thought Process:  Goal Directed  Orientation:  NA  Thought Content:  Logical  Suicidal Thoughts:  No  Homicidal Thoughts:  No  Memory:  Immediate;   Fair Recent;   Fair Remote;   Fair  Judgement:  Fair  Insight:  Fair  Psychomotor Activity:  Normal  Concentration:  Concentration: Fair  Recall:  [employer] of Knowledge:  Fair  Language:  Fair  Akathisia:  No  Handed:  Right  AIMS (if indicated):     Assets:  Desire for Improvement Housing Physical Health Social Support  ADL's:  Intact  Cognition:  WNL  Sleep:  Number of Hours: 7.5     Have you used any form of tobacco in the last 30 days? (Cigarettes, Smokeless Tobacco, Cigars, and/or Pipes): No  Has this patient used any form of tobacco in the last 30 days? (Cigarettes, Smokeless Tobacco, Cigars, and/or Pipes) Yes, No  Blood Alcohol level:  No results found for: [facility]  Metabolic Disorder Labs:  No results found for: HGBA1C, MPG No results found for: PROLACTIN No results found for: CHOL, TRIG, HDL, CHOLHDL, VLDL, LDLCALC  See Psychiatric Specialty Exam and Suicide Risk Assessment completed by Attending Physician prior to discharge.  Discharge destination:  Home  Is patient on multiple antipsychotic therapies at discharge:  No   Has Patient had three or more failed trials of antipsychotic monotherapy by history:  No  Recommended Plan for Multiple Antipsychotic Therapies: NA  Discharge Instructions    Diet - low sodium heart healthy   Complete by: As directed    Increase activity slowly   Complete by: As directed      Allergies as of 07/18/2019      Reactions   Amoxicillin Other (See Comments)   Unknown childhood allergy      Medication List    STOP taking these medications   desvenlafaxine 50 MG 24 hr tablet Commonly known as: PRISTIQ     TAKE these medications      Indication  clonazePAM 0.5 MG tablet Commonly known as: KLONOPIN Take 1 tablet (0.5 mg total) by mouth at bedtime.  Indication: Feeling Anxious   DULoxetine 60 MG capsule Commonly known as: CYMBALTA Take 1 capsule (60 mg total) by mouth daily. Start taking on: July 19, 2019  Indication: Major Depressive Disorder   melatonin 5 MG Tabs Take 1 tablet (5 mg total) by mouth at bedtime.  Indication: [facility] Follow up.   Specialty: Behavioral Health Why: You have an appointment scheduled with Whitney on 07/22/19 @ 2:30 PM via WebEx. Thank You! Contact information: [place] [phone]       [facility] Follow up on 07/24/2019.   Specialty: Behavioral Health Why: You have an appointment scheduled with Dr. Creig Hines on 07/24/19 at 9:20AM. This appointment will be in person. Please take list of medications and discharge summery with you. Thank You! Contact information: [place] [address] [phone]           Follow-up recommendations:  Activity:  Activity as tolerated Diet:  Regular diet Other:  Outpatient follow-up as recommended above  Comments: Prescriptions provided at discharge  Signed: Alethia Berthold, MD 07/18/2019, 10:33 AM

## 2019-07-18 NOTE — Progress Notes (Signed)
Recreation Therapy Notes  INPATIENT RECREATION TR PLAN  Patient Details Name: Cynthia Castaneda MRN: [id number] DOB: 11-09-1995 Today's Date: 07/18/2019  Rec Therapy Plan Is patient appropriate for Therapeutic Recreation?: Yes Treatment times per week: at least 3 Estimated Length of Stay: 5-7 days TR Treatment/Interventions: Group participation (Comment)  Discharge Criteria Pt will be discharged from therapy if:: Discharged Treatment plan/goals/alternatives discussed and agreed upon by:: Patient/family  Discharge Summary Short term goals set: Patient will identify 3 positive ways of expressing themselves within 5 recreation therapy group sessions Short term goals met: Complete Progress toward goals comments: Groups attended Which groups?: AAA/T, Other (Comment), Coping skills(Problem Solving, Self-care) Reason goals not met: N/A Therapeutic equipment acquired: N/A Reason patient discharged from therapy: Discharge from hospital Pt/family agrees with progress & goals achieved: Yes Date patient discharged from therapy: 07/18/19   Sencere Symonette 07/18/2019, 11:46 AM

## 2019-07-18 NOTE — Progress Notes (Signed)
Recreation Therapy Notes  Date: 07/18/2019  Time: 9:30 am  Location: Room 21   Behavioral response: Appropriate   Intervention Topic: Animal Assisted Therapy   Discussion/Intervention:  Animal Assisted Therapy took place today during group.  Animal Assisted Therapy is the planned inclusion of an animal in a patient's treatment plan. The patients were able to engage in therapy with an animal during group. Participants were educated on what a service dog is and the different between a support dog and a service dog. Patient were informed on the many animal needs there are and how their needs are similar. Individuals were enlightened on the process to get a service animal or support animal. Patients got the opportunity to pet the animal and were offered emotional support from the animal and staff.  Clinical Observations/Feedback:  Patient came to group and was on topic and was focused on what peers and staff had to say. Participant shared their experiences and history with animals. Individual was social with peers, staff and animal while participating in group.  [name] LRT/CTRS         Cynthia Castaneda 07/18/2019 11:10 AM

## 2019-07-18 NOTE — Progress Notes (Signed)
DISCHARGE NOTE:  Cynthia Castaneda [facility]) left the unit with transportation provided by the patient's mother.  Belongings were verified and returned, follow-up appointments were discussed, and the patient agreed to call the unit should questions arise.  Cynthia Castaneda denied thoughts of harming herself and others and provided evidence of future orientation.  The patient's mood was stable and emotions were well-managed prior to discharge.  Cynthia Castaneda stated, "I can't wait to get home to see my people, but this place really helped."  The patient was walked to the lobby where she met her transportation.

## 2019-07-18 NOTE — BHH Group Notes (Signed)
LCSW Group Therapy Note  07/18/2019 2:29 PM  Type of Therapy/Topic:  Group Therapy:  Feelings about Diagnosis  Participation Level:  Did Not Attend   Description of Group:   This group will allow patients to explore their thoughts and feelings about diagnoses they have received. Patients will be guided to explore their level of understanding and acceptance of these diagnoses. Facilitator will encourage patients to process their thoughts and feelings about the reactions of others to their diagnosis and will guide patients in identifying ways to discuss their diagnosis with significant others in their lives. This group will be process-oriented, with patients participating in exploration of their own experiences, giving and receiving support, and processing challenge from other group members.   Therapeutic Goals: 1. Patient will demonstrate understanding of diagnosis as evidenced by identifying two or more symptoms of the disorder 2. Patient will be able to express two feelings regarding the diagnosis 3. Patient will demonstrate their ability to communicate their needs through discussion and/or role play  Summary of Patient Progress: X  Therapeutic Modalities:   Cognitive Behavioral Therapy Brief Therapy Feelings Identification   Penni Homans, MSW, LCSW 07/18/2019 2:29 PM

## 2019-07-22 ENCOUNTER — Ambulatory Visit (INDEPENDENT_AMBULATORY_CARE_PROVIDER_SITE_OTHER): Payer: No Typology Code available for payment source | Admitting: Professional

## 2019-07-22 ENCOUNTER — Telehealth (HOSPITAL_COMMUNITY): Payer: Self-pay | Admitting: Professional

## 2019-07-22 ENCOUNTER — Other Ambulatory Visit: Payer: Self-pay

## 2019-07-22 DIAGNOSIS — F332 Major depressive disorder, recurrent severe without psychotic features: Secondary | ICD-10-CM | POA: Diagnosis not present

## 2019-07-22 DIAGNOSIS — F4312 Post-traumatic stress disorder, chronic: Secondary | ICD-10-CM | POA: Diagnosis not present

## 2019-07-24 ENCOUNTER — Ambulatory Visit: Payer: No Typology Code available for payment source | Admitting: Psychiatry

## 2019-08-26 ENCOUNTER — Ambulatory Visit: Payer: PRIVATE HEALTH INSURANCE | Attending: Internal Medicine

## 2019-08-26 DIAGNOSIS — Z23 Encounter for immunization: Secondary | ICD-10-CM

## 2019-08-26 NOTE — Progress Notes (Signed)
   Covid-19 Vaccination Clinic  Name:  Cynthia Castaneda    MRN: [id number] DOB: Jun 07, 1995  08/26/2019  [name] was observed post Covid-19 immunization for 15 minutes without incident. She was provided with Vaccine Information Sheet and instruction to access the V-Safe system.   [name] was instructed to call 911 with any severe reactions post vaccine: Marland Kitchen Difficulty breathing  . Swelling of face and throat  . A fast heartbeat  . A bad rash all over body  . Dizziness and weakness   Immunizations Administered    Name Date Dose VIS Date Route   Pfizer COVID-19 Vaccine 08/26/2019  2:40 PM 0.3 mL 06/12/2018 Intramuscular   Manufacturer: [employer], [employer]   Lot: [id number]   NDC: [address]-1
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
## 2019-09-17 ENCOUNTER — Ambulatory Visit: Payer: No Typology Code available for payment source | Attending: Internal Medicine

## 2019-09-17 DIAGNOSIS — Z23 Encounter for immunization: Secondary | ICD-10-CM

## 2019-09-17 NOTE — Progress Notes (Signed)
   Covid-19 Vaccination Clinic  Name:  Cynthia Castaneda    MRN: [id number] DOB: 02/15/1996  09/17/2019  [name] was observed post Covid-19 immunization for 15 minutes without incident. She was provided with Vaccine Information Sheet and instruction to access the V-Safe system.   [name] was instructed to call 911 with any severe reactions post vaccine: Marland Kitchen Difficulty breathing  . Swelling of face and throat  . A fast heartbeat  . A bad rash all over body  . Dizziness and weakness   Immunizations Administered    Name Date Dose VIS Date Route   Pfizer COVID-19 Vaccine 09/17/2019  3:18 PM 0.3 mL 06/12/2018 Intramuscular   Manufacturer: [employer], [employer]   Lot: [id number]   NDC: [address]-4

## 2019-09-24 ENCOUNTER — Other Ambulatory Visit: Payer: Self-pay | Admitting: Psychiatry

## 2019-10-02 ENCOUNTER — Encounter: Payer: Self-pay | Admitting: Psychiatry

## 2019-10-02 ENCOUNTER — Other Ambulatory Visit: Payer: Self-pay

## 2019-10-02 ENCOUNTER — Ambulatory Visit (INDEPENDENT_AMBULATORY_CARE_PROVIDER_SITE_OTHER): Payer: No Typology Code available for payment source | Admitting: Psychiatry

## 2019-10-02 VITALS — Ht 62.5 in | Wt 115.0 lb

## 2019-10-02 DIAGNOSIS — F902 Attention-deficit hyperactivity disorder, combined type: Secondary | ICD-10-CM | POA: Diagnosis not present

## 2019-10-02 DIAGNOSIS — F422 Mixed obsessional thoughts and acts: Secondary | ICD-10-CM | POA: Diagnosis not present

## 2019-10-02 DIAGNOSIS — F4312 Post-traumatic stress disorder, chronic: Secondary | ICD-10-CM

## 2019-10-02 DIAGNOSIS — F5082 Avoidant/restrictive food intake disorder: Secondary | ICD-10-CM

## 2019-10-02 DIAGNOSIS — F331 Major depressive disorder, recurrent, moderate: Secondary | ICD-10-CM | POA: Diagnosis not present

## 2019-10-02 MED ORDER — NORTRIPTYLINE HCL 25 MG PO CAPS: 25.0000 mg | ORAL_CAPSULE | Freq: Two times a day (BID) | ORAL | 1 refills | Status: DC

## 2019-10-02 MED ORDER — CLONAZEPAM 0.5 MG PO TABS: 0.5000 mg | ORAL_TABLET | Freq: Two times a day (BID) | ORAL | 1 refills | Status: DC | PRN

## 2019-10-02 NOTE — Patient Instructions (Signed)
Titrate Pamelor 25 mg capsule as 1 every bedtime for 10 days then advance to 1 every morning and bedtime
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
## 2019-10-15 ENCOUNTER — Telehealth: Payer: Self-pay | Admitting: Psychiatry

## 2019-10-15 NOTE — Telephone Encounter (Signed)
Mother Cynthia Castaneda calls that Cynthia Castaneda told her to phone me about medication as she did not take more than 4 days of the Pamelor as it caused drowsiness and she felt more depressed when drowsy.  The patient reported that she did not want an SSRI at last appointment and had just tried several SNRIs.  As the Pamelor ia a TCA was not acceptable even at the low dose making her too tired and "catatonic", mother recalls that she did well on Zoloft and  not quite as well on Paxil concluding best to try Zoloft not likely to tolerate Remeron if she does not appreciate the  Pamelor.  We reviewed 2003 black box suicide warnings on all antidepressants, atypical antipsychotics, and antiepileptics.  As the patient became suicidal with the break-up with boyfriend, she has more time with the boyfriend when is helping her than when she is on her own.  Mother is continuing to search for a therapist as the last one they found could not see for 12-month.  I support therapy and review obstacles to medications  that can be monitored, mother or patient to leave a message if they are willing to start Zoloft they can leave me the meeage to send it to their pharmacy and plan follow-up as well as therapy.

## 2019-10-15 NOTE — Telephone Encounter (Signed)
Mother, Waynetta Sandy, called to report that Cynthia Castaneda had tried the [employer] for 4 days and then stopped it. She stated that the medication made her feel feverish, tired and weak. On day 4 she was almost catatonic and dissociative. So she stopped it. They wish to try anything that will not have risk of suicide as well.

## 2019-10-28 ENCOUNTER — Other Ambulatory Visit: Payer: Self-pay

## 2019-10-28 ENCOUNTER — Ambulatory Visit (INDEPENDENT_AMBULATORY_CARE_PROVIDER_SITE_OTHER): Payer: No Typology Code available for payment source | Admitting: Psychiatry

## 2019-10-28 ENCOUNTER — Encounter: Payer: Self-pay | Admitting: Psychiatry

## 2019-10-28 VITALS — Ht 62.5 in | Wt 118.0 lb

## 2019-10-28 DIAGNOSIS — F4312 Post-traumatic stress disorder, chronic: Secondary | ICD-10-CM | POA: Diagnosis not present

## 2019-10-28 DIAGNOSIS — F422 Mixed obsessional thoughts and acts: Secondary | ICD-10-CM | POA: Diagnosis not present

## 2019-10-28 DIAGNOSIS — F331 Major depressive disorder, recurrent, moderate: Secondary | ICD-10-CM

## 2019-10-28 DIAGNOSIS — F902 Attention-deficit hyperactivity disorder, combined type: Secondary | ICD-10-CM | POA: Diagnosis not present

## 2019-10-28 DIAGNOSIS — F5082 Avoidant/restrictive food intake disorder: Secondary | ICD-10-CM

## 2019-10-28 MED ORDER — ARIPIPRAZOLE 5 MG PO TABS: 5.0000 mg | ORAL_TABLET | Freq: Every day | ORAL | 0 refills | Status: DC

## 2019-10-28 MED ORDER — METHYLPHENIDATE HCL 10 MG PO TABS: 10.0000 mg | ORAL_TABLET | Freq: Two times a day (BID) | ORAL | 0 refills | Status: DC
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
## 2019-11-06 ENCOUNTER — Telehealth: Payer: Self-pay

## 2019-11-06 NOTE — Telephone Encounter (Signed)
Prior authorization submitted with cover my meds for METHYLPHENIDATE 10 MG #60/30 DAY with [facility] approved effective 10/29/2019-10/28/2022.

## 2019-11-18 ENCOUNTER — Other Ambulatory Visit: Payer: Self-pay | Admitting: Psychiatry

## 2019-11-18 DIAGNOSIS — F902 Attention-deficit hyperactivity disorder, combined type: Secondary | ICD-10-CM

## 2019-11-18 DIAGNOSIS — F422 Mixed obsessional thoughts and acts: Secondary | ICD-10-CM

## 2019-11-18 DIAGNOSIS — F331 Major depressive disorder, recurrent, moderate: Secondary | ICD-10-CM

## 2019-11-18 DIAGNOSIS — F4312 Post-traumatic stress disorder, chronic: Secondary | ICD-10-CM

## 2019-11-25 ENCOUNTER — Encounter: Payer: Self-pay | Admitting: Psychiatry

## 2019-11-25 ENCOUNTER — Ambulatory Visit (INDEPENDENT_AMBULATORY_CARE_PROVIDER_SITE_OTHER): Payer: No Typology Code available for payment source | Admitting: Psychiatry

## 2019-11-25 ENCOUNTER — Other Ambulatory Visit: Payer: Self-pay

## 2019-11-25 VITALS — Ht 62.5 in | Wt 117.0 lb

## 2019-11-25 DIAGNOSIS — F4312 Post-traumatic stress disorder, chronic: Secondary | ICD-10-CM | POA: Diagnosis not present

## 2019-11-25 DIAGNOSIS — F422 Mixed obsessional thoughts and acts: Secondary | ICD-10-CM | POA: Diagnosis not present

## 2019-11-25 DIAGNOSIS — F902 Attention-deficit hyperactivity disorder, combined type: Secondary | ICD-10-CM

## 2019-11-25 DIAGNOSIS — F5082 Avoidant/restrictive food intake disorder: Secondary | ICD-10-CM

## 2019-11-25 DIAGNOSIS — F331 Major depressive disorder, recurrent, moderate: Secondary | ICD-10-CM | POA: Diagnosis not present
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
## 2019-12-10 ENCOUNTER — Other Ambulatory Visit: Payer: Self-pay

## 2019-12-10 DIAGNOSIS — F331 Major depressive disorder, recurrent, moderate: Secondary | ICD-10-CM

## 2019-12-10 DIAGNOSIS — F4312 Post-traumatic stress disorder, chronic: Secondary | ICD-10-CM

## 2019-12-10 DIAGNOSIS — F422 Mixed obsessional thoughts and acts: Secondary | ICD-10-CM

## 2019-12-10 DIAGNOSIS — F902 Attention-deficit hyperactivity disorder, combined type: Secondary | ICD-10-CM

## 2019-12-10 MED ORDER — ARIPIPRAZOLE 5 MG PO TABS: ORAL_TABLET | ORAL | 1 refills | Status: DC

## 2020-01-06 ENCOUNTER — Ambulatory Visit (INDEPENDENT_AMBULATORY_CARE_PROVIDER_SITE_OTHER): Payer: No Typology Code available for payment source | Admitting: Family Medicine

## 2020-01-06 ENCOUNTER — Encounter: Payer: Self-pay | Admitting: Family Medicine

## 2020-01-06 ENCOUNTER — Other Ambulatory Visit: Payer: Self-pay

## 2020-01-06 VITALS — BP 107/71 | HR 101 | Temp 98.0°F | Resp 16 | Wt 116.0 lb

## 2020-01-06 DIAGNOSIS — R350 Frequency of micturition: Secondary | ICD-10-CM | POA: Diagnosis not present

## 2020-01-06 DIAGNOSIS — R300 Dysuria: Secondary | ICD-10-CM

## 2020-01-06 DIAGNOSIS — N3001 Acute cystitis with hematuria: Secondary | ICD-10-CM | POA: Diagnosis not present

## 2020-01-06 MED ORDER — CEPHALEXIN 500 MG PO TABS: 500.0000 mg | ORAL_TABLET | Freq: Three times a day (TID) | ORAL | 0 refills | Status: AC

## 2020-01-06 NOTE — Progress Notes (Signed)
     Established patient visit   Patient: Cynthia Castaneda   DOB: 1995-05-02   24 y.o. Female  MRN: [id number] Visit Date: 01/06/2020  Today's healthcare provider: Mila Merry, MD   Chief Complaint  Patient presents with  . Dysuria   Subjective    Dysuria  This is a new problem. Episode onset: 3 weeks ago. The problem has been gradually worsening. The quality of the pain is described as burning. There has been no fever. Associated symptoms include frequency, hesitancy, nausea and urgency. Pertinent negatives include no chills, discharge, hematuria, sweats or vomiting. Treatments tried: AZO. The treatment provided mild relief. Her past medical history is significant for recurrent UTIs.        Medications: Outpatient Medications Prior to Visit  Medication Sig  . ARIPiprazole (ABILIFY) 5 MG tablet TAKE 1 TABLET BY MOUTH EVERYDAY AT BEDTIME  . melatonin 5 MG TABS Take 1 tablet (5 mg total) by mouth at bedtime.  . methylphenidate (RITALIN) 10 MG tablet Take 1 tablet (10 mg total) by mouth 2 (two) times daily.  . clonazePAM (KLONOPIN) 0.5 MG tablet Take 1 tablet (0.5 mg total) by mouth 2 (two) times daily as needed for anxiety. (Patient not taking: Reported on 01/06/2020)   No facility-administered medications prior to visit.    Review of Systems  Constitutional: Negative for chills.  Gastrointestinal: Positive for abdominal pain and nausea. Negative for vomiting.  Genitourinary: Positive for dysuria, frequency, hesitancy, urgency and vaginal discharge (color varies). Negative for hematuria.      Objective    BP 107/71 (BP Location: Left Arm, Patient Position: Sitting, Cuff Size: Normal)   Pulse (!) 101   Temp 98 F (36.7 C) (Oral)   Resp 16   Wt 116 lb (52.6 kg)   BMI 20.88 kg/m    Physical Exam  General appearance: Well developed, well nourished female, cooperative and in no acute distress Head: Normocephalic, without obvious abnormality, atraumatic Respiratory:  Respirations even and unlabored, normal respiratory rate Extremities: All extremities are intact.  Skin: Skin color, texture, turgor normal. No rashes seen  Psych: Appropriate mood and affect. Neurologic: Mental status: Alert, oriented to person, place, and time, thought content appropriate.   U/a unreadable due to Azo.  Microscopic exam finds 25-50 WBC 10-25 RBC 2-5 Epis.   Assessment & Plan     1. Urinary frequency   2. Dysuria   3. Acute cystitis with hematuria  - Urine Culture - Cephalexin 500 MG tablet; Take 1 tablet (500 mg total) by mouth 3 (three) times daily for 5 days.  Dispense: 15 tablet; Refill: 0    She declined flu vaccine today.         Mila Merry, MD  [facility] [phone] (phone) [phone] (fax)  [facility]

## 2020-01-07 ENCOUNTER — Encounter: Payer: Self-pay | Admitting: Family Medicine

## 2020-01-10 LAB — URINE CULTURE

## 2020-01-20 ENCOUNTER — Encounter: Payer: Self-pay | Admitting: Physician Assistant

## 2020-01-20 ENCOUNTER — Telehealth (INDEPENDENT_AMBULATORY_CARE_PROVIDER_SITE_OTHER): Payer: No Typology Code available for payment source | Admitting: Physician Assistant

## 2020-01-20 DIAGNOSIS — J301 Allergic rhinitis due to pollen: Secondary | ICD-10-CM

## 2020-01-20 DIAGNOSIS — J40 Bronchitis, not specified as acute or chronic: Secondary | ICD-10-CM

## 2020-01-20 DIAGNOSIS — R059 Cough, unspecified: Secondary | ICD-10-CM | POA: Diagnosis not present

## 2020-01-20 MED ORDER — BENZONATATE 200 MG PO CAPS: 200.0000 mg | ORAL_CAPSULE | Freq: Three times a day (TID) | ORAL | 0 refills | Status: DC | PRN

## 2020-01-20 MED ORDER — FLUTICASONE PROPIONATE 50 MCG/ACT NA SUSP: 2.0000 | Freq: Every day | NASAL | 6 refills | Status: DC

## 2020-01-20 NOTE — Progress Notes (Signed)
MyChart Video Visit    Virtual Visit via Video Note   This visit type was conducted due to national recommendations for restrictions regarding the COVID-19 Pandemic (e.g. social distancing) in an effort to limit this patient's exposure and mitigate transmission in our community. This patient is at least at moderate risk for complications without adequate follow up. This format is felt to be most appropriate for this patient at this time. Physical exam was limited by quality of the video and audio technology used for the visit.   Patient location: Home Provider location: [facility]  I discussed the limitations of evaluation and management by telemedicine and the availability of in person appointments. The patient expressed understanding and agreed to proceed.  Interactive audio and video communications were attempted, although failed due to patient's inability to connect to video. Continued visit with audio only interaction with patient agreement.  Patient: Cynthia Castaneda   DOB: 01-17-1996   23 y.o. Female  MRN: [id number] Visit Date: 01/20/2020  Today's healthcare provider: Margaretann Loveless, PA-C   Chief Complaint  Patient presents with   Sore Throat   Subjective    Sore Throat  This is a new problem. The current episode started in the past 7 days. The problem has been gradually worsening. There has been no fever. Associated symptoms include coughing and a hoarse voice. Pertinent negatives include no congestion, diarrhea, ear discharge, ear pain, headaches, shortness of breath, trouble swallowing or vomiting. Treatments tried: Claritin and throat lozenges. The treatment provided mild relief.   No loss of smell or taste. Has been doing rapid covid 19 testing at home and all have been negative.  Has recently performed in a musical and feels that is why she is hoarse.   Patient Active Problem List   Diagnosis Date Noted   Major depressive disorder, recurrent episode, moderate  (HCC) 07/13/2019   Chronic post-traumatic stress disorder (PTSD) 07/17/2018   Attention deficit hyperactivity disorder (ADHD), combined type, moderate 07/17/2018   Obsessive compulsive disorder 07/17/2018   Avoidant-restrictive food intake disorder (ARFID) 07/17/2018   Past Medical History:  Diagnosis Date   ADHD (attention deficit hyperactivity disorder)    Allergy    Anxiety    Borderline personality disorder (HCC)    Depression    Eating disorder    Frequent headaches    Migraine    OCD (obsessive compulsive disorder)    PTSD (post-traumatic stress disorder)       Medications: Outpatient Medications Prior to Visit  Medication Sig   ARIPiprazole (ABILIFY) 5 MG tablet TAKE 1 TABLET BY MOUTH EVERYDAY AT BEDTIME   melatonin 5 MG TABS Take 1 tablet (5 mg total) by mouth at bedtime.   methylphenidate (RITALIN) 10 MG tablet Take 1 tablet (10 mg total) by mouth 2 (two) times daily.   clonazePAM (KLONOPIN) 0.5 MG tablet Take 1 tablet (0.5 mg total) by mouth 2 (two) times daily as needed for anxiety. (Patient not taking: Reported on 01/06/2020)   No facility-administered medications prior to visit.    Review of Systems  Constitutional: Negative for chills, diaphoresis, fatigue and fever.  HENT: Positive for hoarse voice, postnasal drip and voice change. Negative for congestion, ear discharge, ear pain, rhinorrhea, sinus pressure, sinus pain and trouble swallowing.   Respiratory: Positive for cough. Negative for chest tightness, shortness of breath and wheezing.   Cardiovascular: Negative for chest pain, palpitations and leg swelling.  Gastrointestinal: Negative for diarrhea and vomiting.  Neurological: Negative for dizziness and headaches.  Last CBC No results found for: WBC, HGB, HCT, MCV, MCH, RDW, PLT Last metabolic panel No results found for: GLUCOSE, NA, K, CL, CO2, BUN, CREATININE, GFRNONAA, GFRAA, CALCIUM, PHOS, PROT, ALBUMIN, LABGLOB, AGRATIO, BILITOT,  ALKPHOS, AST, ALT, ANIONGAP    Objective    There were no vitals taken for this visit. BP Readings from Last 3 Encounters:  01/06/20 107/71  07/13/19 134/87  10/20/17 100/60   Wt Readings from Last 3 Encounters:  01/06/20 116 lb (52.6 kg)  07/13/19 110 lb (49.9 kg)  10/20/17 105 lb 9.6 oz (47.9 kg) (9 %, Z= -1.34)*   * Growth percentiles are based on CDC (Girls, 2-20 Years) data.      Physical Exam Vitals reviewed.  Constitutional:      General: She is not in acute distress. Pulmonary:     Effort: No respiratory distress.  Neurological:     Mental Status: She is alert.        Assessment & Plan     1. Seasonal allergic rhinitis due to pollen Add flonase for post nasal drainage.  - fluticasone (FLONASE) 50 MCG/ACT nasal spray; Place 2 sprays into both nostrils daily.  Dispense: 16 g; Refill: 6  2. Cough Tessalon perles for cough.  - benzonatate (TESSALON) 200 MG capsule; Take 1 capsule (200 mg total) by mouth 3 (three) times daily as needed.  Dispense: 30 capsule; Refill: 0  3. Bronchitis Prednisone for possible progression to bronchitis.  - predniSONE (DELTASONE) 10 MG tablet; Take 1 tablet (10 mg total) by mouth daily with breakfast.  Dispense: 5 tablet; Refill: 0   No follow-ups on file.     I discussed the assessment and treatment plan with the patient. The patient was provided an opportunity to ask questions and all were answered. The patient agreed with the plan and demonstrated an understanding of the instructions.   The patient was advised to call back or seek an in-person evaluation if the symptoms worsen or if the condition fails to improve as anticipated.  I provided 14 minutes of non-face-to-face time during this encounter.  Delmer Islam, PA-C, have reviewed all documentation for this visit. The documentation on 01/23/20 for the exam, diagnosis, procedures, and orders are all accurate and complete.  [facility] [phone] (phone) [phone] (fax)  [facility]

## 2020-01-20 NOTE — Patient Instructions (Signed)
Allergies, Adult °An allergy is when your body's defense system (immune system) overreacts to an otherwise harmless substance (allergen) that you breathe in or eat or something that touches your skin. When you come into contact with something that you are allergic to, your immune system produces certain proteins (antibodies). These proteins cause cells to release chemicals (histamines) that trigger the symptoms of an allergic reaction. °Allergies often affect the nasal passages (allergic rhinitis), eyes (allergic conjunctivitis), skin (atopic dermatitis), and stomach. Allergies can be mild or severe. Allergies cannot spread from person to person (are not contagious). They can develop at any age and may be outgrown. °What increases the risk? °You may be at greater risk of allergies if other people in your family have allergies. °What are the signs or symptoms? °Symptoms depend on what type of allergy you have. They may include: °· Runny, stuffy nose. °· Sneezing. °· Itchy mouth, ears, or throat. °· Postnasal drip. °· Sore throat. °· Itchy, red, watery, or puffy eyes. °· Skin rash or hives. °· Stomach pain. °· Vomiting. °· Diarrhea. °· Bloating. °· Wheezing or coughing. °People with a severe allergy to food, medicine, or an insect bite may have a life-threatening allergic reaction (anaphylaxis). Symptoms of anaphylaxis include: °· Hives. °· Itching. °· Flushed face. °· Swollen lips, tongue, or mouth. °· Tight or swollen throat. °· Chest pain or tightness in the chest. °· Trouble breathing or shortness of breath. °· Rapid heartbeat. °· Dizziness or fainting. °· Vomiting. °· Diarrhea. °· Pain in the abdomen. °How is this diagnosed? °This condition is diagnosed based on: °· Your symptoms. °· Your family and medical history. °· A physical exam. °You may need to see a health care provider who specializes in treating allergies (allergist). You may also have tests, including: °· Skin tests to see which allergens are causing  your symptoms, such as: °? Skin prick test. In this test, your skin is pricked with a tiny needle and exposed to small amounts of possible allergens to see if your skin reacts. °? Intradermal skin test. In this test, a small amount of allergen is injected under your skin to see if your skin reacts. °? Patch test. In this test, a small amount of allergen is placed on your skin and then your skin is covered with a bandage. Your health care provider will check your skin after a couple of days to see if a rash has developed. °· Blood tests. °· Challenges tests. In this test, you inhale a small amount of allergen by mouth to see if you have an allergic reaction. °You may also be asked to: °· Keep a food diary. A food diary is a record of all the foods and drinks you have in a day and any symptoms you experience. °· Practice an elimination diet. An elimination diet involves eliminating specific foods from your diet and then adding them back in one by one to find out if a certain food causes an allergic reaction. °How is this treated? °Treatment for allergies depends on your symptoms. Treatment may include: °· Cold compresses to soothe itching and swelling. °· Eye drops. °· Nasal sprays. °· Using a saline spray or container (neti pot) to flush out the nose (nasal irrigation). These methods can help clear away mucus and keep the nasal passages moist. °· Using a humidifier. °· Oral antihistamines or other medicines to block allergic reaction and inflammation. °· Skin creams to treat rashes or itching. °· Diet changes to eliminate food allergy triggers. °·   Repeated exposure to tiny amounts of allergens to build up a tolerance and prevent future allergic reactions (immunotherapy). These include: °? Allergy shots. °? Oral treatment. This involves taking small doses of an allergen under the tongue (sublingual immunotherapy). °· Emergency epinephrine injection (auto-injector) in case of an allergic emergency. This is a  self-injectable, pre-measured medicine that must be given within the first few minutes of a serious allergic reaction. °Follow these instructions at home: ° °  ° °  ° °· Avoid known allergens whenever possible. °· If you suffer from airborne allergens, wash out your nose daily. You can do this with a saline spray or a neti pot to flush out your nose (nasal irrigation). °· Take over-the-counter and prescription medicines only as told by your health care provider. °· Keep all follow-up visits as told by your health care provider. This is important. °· If you are at risk of a severe allergic reaction (anaphylaxis), keep your auto-injector with you at all times. °· If you have ever had anaphylaxis, wear a medical alert bracelet or necklace that states you have a severe allergy. °Contact a health care provider if: °· Your symptoms do not improve with treatment. °Get help right away if: °· You have symptoms of anaphylaxis, such as: °? Swollen mouth, tongue, or throat. °? Pain or tightness in your chest. °? Trouble breathing or shortness of breath. °? Dizziness or fainting. °? Severe abdominal pain, vomiting, or diarrhea. °This information is not intended to replace advice given to you by your health care provider. Make sure you discuss any questions you have with your health care provider. °Document Revised: 06/28/2017 Document Reviewed: 10/21/2015 °Elsevier Patient Education © 2020 [employer] ° °

## 2020-01-21 ENCOUNTER — Ambulatory Visit: Payer: Self-pay

## 2020-01-21 NOTE — Telephone Encounter (Signed)
Patient mother called stating that her daughter had virtual visit yesterday with jennifer Burnette for sinus, sore throat. She has tested negative for COVID-19 in past week with home test, but they will retest today.  Mom called because her daughter started to reportst that her chest felt heavy.  Per mom patient has cough with green phlegm. Sore throat is getting better.  She is not SOB and is anxious to visit a friend.She has sinus drainage. She has no fever.  Mom feels that her daughter is a little more tired than usual. No loss of taste or smell .  Mom and daughter have been fully vaccinated.  Home care was read to mom. Note will be routed to office. Mom was asked to call back if symptoms worsen or go to ER.  She verbalized understanding and states they will test today.   Reason for Disposition  COVID-19 Disease, questions about  Answer Assessment - Initial Assessment Questions 1. COVID-19 DIAGNOSIS: "Who made your Coronavirus (COVID-19) diagnosis?" "Was it confirmed by a positive lab test?" If not diagnosed by a HCP, ask "Are there lots of cases (community spread) where you live?" (See public health department website, if unsure)     yes 2. COVID-19 EXPOSURE: "Was there any known exposure to COVID before the symptoms began?" CDC Definition of close contact: within 6 feet (2 meters) for a total of 15 minutes or more over a 24-hour period.      3. ONSET: "When did the COVID-19 symptoms start?"      1week ago 4. WORST SYMPTOM: "What is your worst symptom?" (e.g., cough, fever, shortness of breath, muscle aches)    Cough phlgm 5. COUGH: "Do you have a cough?" If Yes, ask: "How bad is the cough?"     yes 6. FEVER: "Do you have a fever?" If Yes, ask: "What is your temperature, how was it measured, and when did it start?"     No 7. RESPIRATORY STATUS: "Describe your breathing?" (e.g., shortness of breath, wheezing, unable to speak)      Not SOB 8. BETTER-SAME-WORSE: "Are you getting better, staying  the same or getting worse compared to yesterday?"  If getting worse, ask, "In what way?"   same 9. HIGH RISK DISEASE: "Do you have any chronic medical problems?" (e.g., asthma, heart or lung disease, weak immune system, obesity, etc.)     no 10. PREGNANCY: "Is there any chance you are pregnant?" "When was your last menstrual period?"     11. OTHER SYMPTOMS: "Do you have any other symptoms?"  (e.g., chills, fatigue, headache, loss of smell or taste, muscle pain, sore throat; new loss of smell or taste especially support the diagnosis of COVID-19)      Sore throat,tired  Protocols used: CORONAVIRUS (COVID-19) DIAGNOSED OR SUSPECTED-A-AH

## 2020-01-22 NOTE — Telephone Encounter (Signed)
Her symptoms should be mild since she had vaccine. She is still contagious so she should quarantine for 10 days as of the start of her symptoms. If she has trouble with wheezing or feeling short winded then we can prescribe an inhaler.

## 2020-01-22 NOTE — Telephone Encounter (Signed)
I called and spoke with patient advising her of the message below. She verbalized understanding. Patient states she is not having any issues with her breathing and declined a prescription for an inhaler at this time.

## 2020-01-22 NOTE — Telephone Encounter (Signed)
Our office recieved a fax from after hours nurse line reporting that patient's mom Magda Paganini called last night at 9:28pm to inform our office that her daughters COVID test results are positive.

## 2020-01-23 ENCOUNTER — Telehealth: Payer: Self-pay

## 2020-01-23 MED ORDER — PREDNISONE 10 MG PO TABS: 10.0000 mg | ORAL_TABLET | Freq: Every day | ORAL | 0 refills | Status: DC

## 2020-01-23 NOTE — Telephone Encounter (Signed)
Copied from CRM [id number]. Topic: General - Other >> Jan 23, 2020  1:12 PM Jaquita Rector A wrote: Reason for CRM: Patient mom Magda Paganini  called to inquire of Dr Sherrie Mustache or Joycelyn Man that patient is no longer coughing up the green mucus but does feel like the mucus is in her chest. Mom is concerned and wondering if she may need an antibiotic. If so please send to pharmacy on file and call Magda Paganini and let her know Call Ph#  [phone]

## 2020-01-23 NOTE — Telephone Encounter (Signed)
Sounds like some symptoms have been improving and may be more viral bronchitis. Will send in prednisone for inflammation in the chest. She also has the tessalon perles for the cough already sent in. Definitely continue to monitor and if symptoms continue to worsen please let us know.

## 2020-01-27 ENCOUNTER — Ambulatory Visit: Payer: No Typology Code available for payment source | Admitting: Psychiatry

## 2020-01-27 NOTE — Telephone Encounter (Signed)
Per mother patient is much better and that he didn't get the prednisone.

## 2020-02-05 ENCOUNTER — Encounter: Payer: Self-pay | Admitting: Psychiatry

## 2020-03-04 ENCOUNTER — Encounter: Payer: Self-pay | Admitting: Adult Health

## 2020-03-04 ENCOUNTER — Telehealth (INDEPENDENT_AMBULATORY_CARE_PROVIDER_SITE_OTHER): Payer: No Typology Code available for payment source | Admitting: Adult Health

## 2020-03-04 DIAGNOSIS — J069 Acute upper respiratory infection, unspecified: Secondary | ICD-10-CM

## 2020-03-04 DIAGNOSIS — J3089 Other allergic rhinitis: Secondary | ICD-10-CM | POA: Diagnosis not present

## 2020-03-04 DIAGNOSIS — J309 Allergic rhinitis, unspecified: Secondary | ICD-10-CM | POA: Insufficient documentation

## 2020-03-04 NOTE — Progress Notes (Signed)
Virtual Visit via Telephone Note  I connected with Cynthia Castaneda on 03/04/20 at  8:00 AM EST by telephone and verified that I am speaking with the correct person using two identifiers.  Location: Patient: at home Provider: Provider: Provider's office at  [facility], [employer].      I discussed the limitations, risks, security and privacy concerns of performing an evaluation and management service by telephone and the availability of in person appointments. I also discussed with the patient that there may be a patient responsible charge related to this service. The patient expressed understanding and agreed to proceed.   History of Present Illness: She reports she has had 2 days of nasal congestion, sore throat. Body aches thinks from sleeping on couch. Itchy eyes. Ears full.  She has seasonal allergies. She reports it is her allergies. She denies any dental pain, facial pressure or pain.   She has noticed it had correlated with high pollen counts.   Denies any loss of taste or smell. She is not using Flonase or antihistamine. Denies any exposure to mono or strep. She has no cough.   She had covid 1 month ago.   Patient  denies any fever, denies body aches now ,chills, rash, chest pain, shortness of breath, nausea, vomiting, or diarrhea.  Denies dizziness, lightheadedness, pre syncopal or syncopal episodes.  Denies wheezing or distress.    Observations/Objective: No vital signs available.   Patient is alert and oriented and responsive to questions Engages in conversation with provider. Speaks in full sentences without any pauses without any shortness of breath or distress.  Assessment and Plan:  Viral upper respiratory tract infection  Seasonal allergic rhinitis due to other allergic trigger  Start Flonase as ordered. Start Xyzal 5mg  at bedtime or seasonal allergies. Nasal saline ok.  Allergy are given.   Advise covid testing, 03/05/2020 patient declines.  Education for quarantine per 03/07/2020 and symptom management given/   [employer] discussed. The patient was given clear instructions to go to ER or return to medical center if any red flags develop, symptoms do not improve, worsen or new problems develop. They verbalized understanding.   Follow Up Instructions:    I discussed the assessment and treatment plan with the patient. The patient was provided an opportunity to ask questions and all were answered. The patient agreed with the plan and demonstrated an understanding of the instructions.   The patient was advised to call back or seek an in-person evaluation if the symptoms worsen or if the condition fails to improve as anticipated.  I provided 20 minutes of non-face-to-face time during this encounter.  I discussed the limitations of evaluation and management by telemedicine and the availability of in person appointments. The patient expressed understanding and agreed to proceed. [employer], FNP

## 2020-03-06 ENCOUNTER — Other Ambulatory Visit: Payer: Self-pay

## 2020-03-06 ENCOUNTER — Other Ambulatory Visit (HOSPITAL_COMMUNITY)
Admission: RE | Admit: 2020-03-06 | Discharge: 2020-03-06 | Disposition: A | Discharge status: Home / Self Care | Payer: No Typology Code available for payment source | Source: Ambulatory Visit | Attending: Adult Health | Admitting: Adult Health

## 2020-03-06 ENCOUNTER — Other Ambulatory Visit: Payer: Self-pay | Admitting: Adult Health

## 2020-03-06 ENCOUNTER — Encounter: Payer: Self-pay | Admitting: Adult Health

## 2020-03-06 ENCOUNTER — Ambulatory Visit (INDEPENDENT_AMBULATORY_CARE_PROVIDER_SITE_OTHER): Payer: No Typology Code available for payment source | Admitting: Adult Health

## 2020-03-06 VITALS — BP 108/60 | HR 97 | Resp 16 | Wt 117.2 lb

## 2020-03-06 DIAGNOSIS — Z7251 High risk heterosexual behavior: Secondary | ICD-10-CM

## 2020-03-06 DIAGNOSIS — R300 Dysuria: Secondary | ICD-10-CM | POA: Insufficient documentation

## 2020-03-06 DIAGNOSIS — N3001 Acute cystitis with hematuria: Secondary | ICD-10-CM | POA: Diagnosis not present

## 2020-03-06 LAB — POCT URINALYSIS DIPSTICK
Bilirubin, UA: NEGATIVE
Glucose, UA: NEGATIVE
Ketones, UA: NEGATIVE
Nitrite, UA: POSITIVE
Protein, UA: POSITIVE — AB
Spec Grav, UA: 1.015 (ref 1.010–1.025)
Urobilinogen, UA: 0.2 E.U./dL
pH, UA: 6.5 (ref 5.0–8.0)

## 2020-03-06 MED ORDER — CIPROFLOXACIN HCL 500 MG PO TABS: 500.0000 mg | ORAL_TABLET | Freq: Two times a day (BID) | ORAL | 0 refills | Status: DC

## 2020-03-06 NOTE — Telephone Encounter (Signed)
Cipro 500 mg unavailable by pharmacy on back order.

## 2020-03-06 NOTE — Progress Notes (Signed)
Established patient visit   Patient: Cynthia Castaneda   DOB: [birth year]   24 y.o. Female  MRN: [id number] Visit Date: 03/06/2020  Today's healthcare provider: Jairo Ben, FNP   Chief Complaint  Patient presents with  . Dysuria   Subjective    Dysuria  This is a new problem. The current episode started in the past 7 days. The problem has been gradually worsening. The quality of the pain is described as aching, burning, shooting and stabbing. There has been no fever. She is sexually active. Associated symptoms include a discharge, flank pain and frequency. Pertinent negatives include no chills, hematuria, hesitancy, nausea, possible pregnancy, sweats, urgency or vomiting. Treatments tried: Azo.    She has symptoms for one year.   Few sexual partners since last visit wants Gonorrhea chlamydia and trichomonas testing. Denies any known exposures. Denies blood testing.   Patient  denies any fever, body aches,chills, rash, chest pain, shortness of breath, nausea, vomiting, or diarrhea.  Denies dizziness, lightheadedness, pre syncopal or syncopal episodes.    No LMP recorded. (Menstrual status: IUD). Denies any pregnancy.    Patient  denies any fever, body aches,chills, rash, chest pain, shortness of breath, nausea, vomiting, or diarrhea.       Medications: Outpatient Medications Prior to Visit  Medication Sig  . ARIPiprazole (ABILIFY) 5 MG tablet TAKE 1 TABLET BY MOUTH EVERYDAY AT BEDTIME  . melatonin 5 MG TABS Take 1 tablet (5 mg total) by mouth at bedtime.  . methylphenidate (RITALIN) 10 MG tablet Take 1 tablet (10 mg total) by mouth 2 (two) times daily.  . [DISCONTINUED] benzonatate (TESSALON) 200 MG capsule Take 1 capsule (200 mg total) by mouth 3 (three) times daily as needed.  . [DISCONTINUED] clonazePAM (KLONOPIN) 0.5 MG tablet Take 1 tablet (0.5 mg total) by mouth 2 (two) times daily as needed for anxiety. (Patient not taking: Reported on 01/06/2020)  .  [DISCONTINUED] fluticasone (FLONASE) 50 MCG/ACT nasal spray Place 2 sprays into both nostrils daily.  . [DISCONTINUED] predniSONE (DELTASONE) 10 MG tablet Take 1 tablet (10 mg total) by mouth daily with breakfast.   No facility-administered medications prior to visit.    Review of Systems  Constitutional: Negative for chills.  Gastrointestinal: Negative for nausea and vomiting.  Genitourinary: Positive for dysuria, flank pain and frequency. Negative for hematuria, hesitancy and urgency.      Objective    BP 108/60   Pulse 97   Resp 16   Wt 117 lb 3.2 oz (53.2 kg)   SpO2 97%   BMI 21.09 kg/m    Physical Exam Vitals reviewed.  Constitutional:      General: She is not in acute distress.    Appearance: She is well-developed. She is not diaphoretic.     Interventions: She is not intubated. HENT:     Head: Normocephalic and atraumatic.     Right Ear: External ear normal.     Left Ear: External ear normal.     Nose: Nose normal.     Mouth/Throat:     Pharynx: No oropharyngeal exudate.  Eyes:     General: Lids are normal. No scleral icterus.       Right eye: No discharge.        Left eye: No discharge.     Conjunctiva/sclera: Conjunctivae normal.     Right eye: Right conjunctiva is not injected. No exudate or hemorrhage.    Left eye: Left conjunctiva is not injected. No exudate or  hemorrhage.    Pupils: Pupils are equal, round, and reactive to light.  Neck:     Thyroid: No thyroid mass or thyromegaly.     Vascular: Normal carotid pulses. No carotid bruit, hepatojugular reflux or JVD.     Trachea: Trachea and phonation normal. No tracheal tenderness or tracheal deviation.     Meningeal: Brudzinski's sign and Kernig's sign absent.  Cardiovascular:     Rate and Rhythm: Normal rate and regular rhythm.     Pulses: Normal pulses.          Radial pulses are 2+ on the right side and 2+ on the left side.       Dorsalis pedis pulses are 2+ on the right side and 2+ on the left  side.       Posterior tibial pulses are 2+ on the right side and 2+ on the left side.     Heart sounds: Normal heart sounds, S1 normal and S2 normal. Heart sounds not distant. No murmur heard.  No friction rub. No gallop.   Pulmonary:     Effort: Pulmonary effort is normal. No tachypnea, bradypnea, accessory muscle usage or respiratory distress. She is not intubated.     Breath sounds: Normal breath sounds. No stridor. No wheezing or rales.  Chest:     Chest wall: No tenderness.  Abdominal:     General: Bowel sounds are normal. There is no distension or abdominal bruit.     Palpations: Abdomen is soft. There is no shifting dullness, fluid wave, hepatomegaly, splenomegaly, mass or pulsatile mass.     Tenderness: There is no abdominal tenderness. There is no guarding or rebound.     Hernia: No hernia is present.  Musculoskeletal:        General: No tenderness or deformity. Normal range of motion.     Cervical back: Full passive range of motion without pain, normal range of motion and neck supple. No edema, erythema or rigidity. No spinous process tenderness or muscular tenderness. Normal range of motion.  Lymphadenopathy:     Head:     Right side of head: No submental, submandibular, tonsillar, preauricular, posterior auricular or occipital adenopathy.     Left side of head: No submental, submandibular, tonsillar, preauricular, posterior auricular or occipital adenopathy.     Cervical: No cervical adenopathy.     Right cervical: No superficial, deep or posterior cervical adenopathy.    Left cervical: No superficial, deep or posterior cervical adenopathy.     Upper Body:     Right upper body: No supraclavicular or pectoral adenopathy.     Left upper body: No supraclavicular or pectoral adenopathy.  Skin:    General: Skin is warm and dry.     Coloration: Skin is not pale.     Findings: No abrasion, bruising, burn, ecchymosis, erythema, lesion, petechiae or rash.     Nails: There is no  clubbing.  Neurological:     Mental Status: She is alert and oriented to person, place, and time.     GCS: GCS eye subscore is 4. GCS verbal subscore is 5. GCS motor subscore is 6.     Cranial Nerves: No cranial nerve deficit.     Sensory: No sensory deficit.     Motor: No tremor, atrophy, abnormal muscle tone or seizure activity.     Coordination: Coordination normal.     Gait: Gait normal.     Deep Tendon Reflexes: Reflexes are normal and symmetric. Reflexes normal. Babinski sign absent  on the right side. Babinski sign absent on the left side.     Reflex Scores:      Tricep reflexes are 2+ on the right side and 2+ on the left side.      Bicep reflexes are 2+ on the right side and 2+ on the left side.      Brachioradialis reflexes are 2+ on the right side and 2+ on the left side.      Patellar reflexes are 2+ on the right side and 2+ on the left side.      Achilles reflexes are 2+ on the right side and 2+ on the left side. Psychiatric:        Speech: Speech normal.        Behavior: Behavior normal.        Thought Content: Thought content normal.        Judgment: Judgment normal.      Results for orders placed or performed in visit on 03/06/20  POCT urinalysis dipstick  Result Value Ref Range   Color, UA yellow    Clarity, UA clear    Glucose, UA Negative Negative   Bilirubin, UA negative    Ketones, UA negative    Spec Grav, UA 1.015 1.010 - 1.025   Blood, UA large    pH, UA 6.5 5.0 - 8.0   Protein, UA Positive (A) Negative   Urobilinogen, UA 0.2 0.2 or 1.0 E.U./dL   Nitrite, UA positive    Leukocytes, UA Large (3+) (A) Negative   Appearance     Odor      Assessment & Plan    Dysuria - Plan: POCT urinalysis dipstick, Urine Culture, Urine cytology ancillary only  Acute cystitis with hematuria - Plan: DISCONTINUED: ciprofloxacin (CIPRO) 500 MG tablet  High risk sexual behavior, unspecified type   Current Outpatient Medications:  .  ARIPiprazole (ABILIFY) 5 MG  tablet, TAKE 1 TABLET BY MOUTH EVERYDAY AT BEDTIME, Disp: 30 tablet, Rfl: 1 .  melatonin 5 MG TABS, Take 1 tablet (5 mg total) by mouth at bedtime., Disp: 30 tablet, Rfl: 1 .  methylphenidate (RITALIN) 10 MG tablet, Take 1 tablet (10 mg total) by mouth 2 (two) times daily., Disp: 60 tablet, Rfl: 0 .  ofloxacin (FLOXIN) 400 MG tablet, Take 1 tablet (400 mg total) by mouth 2 (two) times daily., Disp: 14 tablet, Rfl: 0   Declines lab blood STD screening.   Orders Placed This Encounter  Procedures  . Urine Culture  . POCT urinalysis dipstick   Pharmacy did not have Cipro - requested Floxin as above per pharmacy request. Urine cytology STD testing ordered.  Return in about 1 week (around 03/13/2020), or if symptoms worsen or fail to improve, for at any time for any worsening symptoms, Go to Emergency room/ urgent care if worse.         Jairo Ben, FNP  [facility] [phone] (phone) [phone] (fax)  [facility]

## 2020-03-06 NOTE — Patient Instructions (Signed)
Ciprofloxacin tablets °What is this medicine? °CIPROFLOXACIN (sip roe FLOX a sin) is a quinolone antibiotic. It is used to treat certain kinds of bacterial infections. It will not work for colds, flu, or other viral infections. °This medicine may be used for other purposes; ask your health care provider or pharmacist if you have questions. °COMMON BRAND NAME(S): Cipro °What should I tell my health care provider before I take this medicine? °They need to know if you have any of these conditions: °· bone problems °· diabetes °· heart disease °· high blood pressure °· history of irregular heartbeat °· history of low levels of potassium in the blood °· joint problems °· kidney disease °· liver disease °· mental illness °· myasthenia gravis °· seizures °· tendon problems °· tingling of the fingers or toes, or other nerve disorder °· an unusual or allergic reaction to ciprofloxacin, other antibiotics or medicines, foods, dyes, or preservatives °· pregnant or trying to get pregnant °· breast-feeding °How should I use this medicine? °Take this medicine by mouth with a full glass of water. Follow the directions on the prescription label. You can take it with or without food. If it upsets your stomach, take it with food. Take your medicine at regular intervals. Do not take your medicine more often than directed. Take all of your medicine as directed even if you think you are better. Do not skip doses or stop your medicine early. °Avoid antacids, aluminum, calcium, iron, magnesium, and zinc products for 6 hours before and 2 hours after taking a dose of this medicine. °A special MedGuide will be given to you by the pharmacist with each prescription and refill. Be sure to read this information carefully each time. °Talk to your pediatrician regarding the use of this medicine in children. Special care may be needed. °Overdosage: If you think you have taken too much of this medicine contact a poison control center or emergency  room at once. °NOTE: This medicine is only for you. Do not share this medicine with others. °What if I miss a dose? °If you miss a dose, take it as soon as you can. If it is almost time for your next dose, take only that dose. Do not take double or extra doses. °What may interact with this medicine? °Do not take this medicine with any of the following medications: °· cisapride °· dronedarone °· flibanserin °· lomitapide °· pimozide °· thioridazine °· tizanidine °This medicine may also interact with the following medications: °· antacids °· birth control pills °· caffeine °· certain medicines for diabetes, like glipizide, glyburide, or insulin °· certain medicines that treat or prevent blood clots like warfarin °· clozapine °· cyclosporine °· didanosine buffered tablets or powder °· dofetilide °· duloxetine °· lanthanum carbonate °· lidocaine °· methotrexate °· multivitamins °· NSAIDS, medicines for pain and inflammation, like ibuprofen or naproxen °· olanzapine °· omeprazole °· other medicines that prolong the QT interval (cause an abnormal heart rhythm) °· phenytoin °· probenecid °· ropinirole °· sevelamer °· sildenafil °· sucralfate °· theophylline °· ziprasidone °· zolpidem °This list may not describe all possible interactions. Give your health care provider a list of all the medicines, herbs, non-prescription drugs, or dietary supplements you use. Also tell them if you smoke, drink alcohol, or use illegal drugs. Some items may interact with your medicine. °What should I watch for while using this medicine? °Tell your doctor or health care provider if your symptoms do not start to get better or if they get worse. °  This medicine may cause serious skin reactions. They can happen weeks to months after starting the medicine. Contact your health care provider right away if you notice fevers or flu-like symptoms with a rash. The rash may be red or purple and then turn into blisters or peeling of the skin. Or, you might  notice a red rash with swelling of the face, lips or lymph nodes in your neck or under your arms. °Do not treat diarrhea with over the counter products. Contact your doctor if you have diarrhea that lasts more than 2 days or if it is severe and watery. °Check with your doctor or health care provider if you get an attack of severe diarrhea, nausea and vomiting, or if you sweat a lot. The loss of too much body fluid can make it dangerous for you to take this medicine. °This medicine may increase blood sugar. Ask your health care provider if changes in diet or medicines are needed if you have diabetes. °You may get drowsy or dizzy. Do not drive, use machinery, or do anything that needs mental alertness until you know how this medicine affects you. Do not sit or stand up quickly, especially if you are an older patient. This reduces the risk of dizzy or fainting spells. °This medicine can make you more sensitive to the sun. Keep out of the sun. If you cannot avoid being in the sun, wear protective clothing and use sunscreen. Do not use sun lamps or tanning beds/booths. °What side effects may I notice from receiving this medicine? °Side effects that you should report to your doctor or health care professional as soon as possible: °· allergic reactions like skin rash or hives, swelling of the face, lips, or tongue °· anxious °· bloody or watery diarrhea °· confusion °· depressed mood °· fast, irregular heartbeat °· fever °· hallucination, loss of contact with reality °· joint, muscle, or tendon pain or swelling °· loss of memory °· pain, tingling, numbness in the hands or feet °· redness, blistering, peeling or loosening of the skin, including inside the mouth °· seizures °· signs and symptoms of aortic dissection such as sudden chest, stomach, or back pain °· signs and symptoms of high blood sugar such as being more thirsty or hungry or having to urinate more than normal. You may also feel very tired or have blurry  vision. °· signs and symptoms of liver injury like dark yellow or brown urine; general ill feeling or flu-like symptoms; light-colored stools; loss of appetite; nausea; right upper belly pain; unusually weak or tired; yellowing of the eyes or skin °· signs and symptoms of low blood sugar such as feeling anxious; confusion; dizziness; increased hunger; unusually weak or tired; sweating; shakiness; cold; irritable; headache; blurred vision; fast heartbeat; loss of consciousness; pale skin °· suicidal thoughts or other mood changes °· sunburn °· unusually weak or tired °Side effects that usually do not require medical attention (report to your doctor or health care professional if they continue or are bothersome): °· dry mouth °· headache °· nausea °· trouble sleeping °This list may not describe all possible side effects. Call your doctor for medical advice about side effects. You may report side effects to FDA at 1-800-FDA-1088. °Where should I keep my medicine? °Keep out of the reach of children. °Store at room temperature below 30 degrees C (86 degrees F). Keep container tightly closed. Throw away any unused medicine after the expiration date. °NOTE: This sheet is a summary. It may not cover   all possible information. If you have questions about this medicine, talk to your doctor, pharmacist, or health care provider. °© 2020 [employer]vier/Gold Standard (2018-07-05 11:26:08) °Urinary Tract Infection, Adult °A urinary tract infection (UTI) is an infection of any part of the urinary tract. The urinary tract includes: °· The kidneys. °· The ureters. °· The bladder. °· The urethra. °These organs make, store, and get rid of pee (urine) in the body. °What are the causes? °This is caused by germs (bacteria) in your genital area. These germs grow and cause swelling (inflammation) of your urinary tract. °What increases the risk? °You are more likely to develop this condition if: °· You have a small, thin tube (catheter) to drain  pee. °· You cannot control when you pee or poop (incontinence). °· You are female, and: °? You use these methods to prevent pregnancy: °§ A medicine that kills sperm (spermicide). °§ A device that blocks sperm (diaphragm). °? You have low levels of a female hormone (estrogen). °? You are pregnant. °· You have genes that add to your risk. °· You are sexually active. °· You take antibiotic medicines. °· You have trouble peeing because of: °? A prostate that is bigger than normal, if you are female. °? A blockage in the part of your body that drains pee from the bladder (urethra). °? A kidney stone. °? A nerve condition that affects your bladder (neurogenic bladder). °? Not getting enough to drink. °? Not peeing often enough. °· You have other conditions, such as: °? Diabetes. °? A weak disease-fighting system (immune system). °? Sickle cell disease. °? Gout. °? Injury of the spine. °What are the signs or symptoms? °Symptoms of this condition include: °· Needing to pee right away (urgently). °· Peeing often. °· Peeing small amounts often. °· Pain or burning when peeing. °· Blood in the pee. °· Pee that smells bad or not like normal. °· Trouble peeing. °· Pee that is cloudy. °· Fluid coming from the vagina, if you are female. °· Pain in the belly or lower back. °Other symptoms include: °· Throwing up (vomiting). °· No urge to eat. °· Feeling mixed up (confused). °· Being tired and grouchy (irritable). °· A fever. °· Watery poop (diarrhea). °How is this treated? °This condition may be treated with: °· Antibiotic medicine. °· Other medicines. °· Drinking enough water. °Follow these instructions at home: ° °Medicines °· Take over-the-counter and prescription medicines only as told by your doctor. °· If you were prescribed an antibiotic medicine, take it as told by your doctor. Do not stop taking it even if you start to feel better. °General instructions °· Make sure you: °? Pee until your bladder is empty. °? Do not hold pee  for a long time. °? Empty your bladder after sex. °? Wipe from front to back after pooping if you are a female. Use each tissue one time when you wipe. °· Drink enough fluid to keep your pee pale yellow. °· Keep all follow-up visits as told by your doctor. This is important. °Contact a doctor if: °· You do not get better after 1-2 days. °· Your symptoms go away and then come back. °Get help right away if: °· You have very bad back pain. °· You have very bad pain in your lower belly. °· You have a fever. °· You are sick to your stomach (nauseous). °· You are throwing up. °Summary °· A urinary tract infection (UTI) is an infection of any part of the urinary tract. °·   This condition is caused by germs in your genital area. °· There are many risk factors for a UTI. These include having a small, thin tube to drain pee and not being able to control when you pee or poop. °· Treatment includes antibiotic medicines for germs. °· Drink enough fluid to keep your pee pale yellow. °This information is not intended to replace advice given to you by your health care provider. Make sure you discuss any questions you have with your health care provider. °Document Revised: 03/22/2018 Document Reviewed: 10/12/2017 °Elsevier Patient Education © 2020 [employer] ° °

## 2020-03-06 NOTE — Progress Notes (Signed)
Cipro started 03/06/2020 and sent for culture and GC / trichomonas  testing.

## 2020-03-09 ENCOUNTER — Encounter: Payer: Self-pay | Admitting: Psychiatry

## 2020-03-09 ENCOUNTER — Ambulatory Visit (INDEPENDENT_AMBULATORY_CARE_PROVIDER_SITE_OTHER): Payer: No Typology Code available for payment source | Admitting: Psychiatry

## 2020-03-09 ENCOUNTER — Other Ambulatory Visit: Payer: Self-pay

## 2020-03-09 VITALS — Ht 63.0 in | Wt 117.0 lb

## 2020-03-09 DIAGNOSIS — F5082 Avoidant/restrictive food intake disorder: Secondary | ICD-10-CM

## 2020-03-09 DIAGNOSIS — F331 Major depressive disorder, recurrent, moderate: Secondary | ICD-10-CM

## 2020-03-09 DIAGNOSIS — R300 Dysuria: Secondary | ICD-10-CM | POA: Insufficient documentation

## 2020-03-09 DIAGNOSIS — F330 Major depressive disorder, recurrent, mild: Secondary | ICD-10-CM

## 2020-03-09 DIAGNOSIS — F4312 Post-traumatic stress disorder, chronic: Secondary | ICD-10-CM | POA: Diagnosis not present

## 2020-03-09 DIAGNOSIS — Z7251 High risk heterosexual behavior: Secondary | ICD-10-CM | POA: Insufficient documentation

## 2020-03-09 DIAGNOSIS — F422 Mixed obsessional thoughts and acts: Secondary | ICD-10-CM | POA: Diagnosis not present

## 2020-03-09 DIAGNOSIS — N3001 Acute cystitis with hematuria: Secondary | ICD-10-CM | POA: Insufficient documentation

## 2020-03-09 DIAGNOSIS — F902 Attention-deficit hyperactivity disorder, combined type: Secondary | ICD-10-CM | POA: Diagnosis not present

## 2020-03-09 MED ORDER — LORAZEPAM 0.5 MG PO TABS: 0.5000 mg | ORAL_TABLET | Freq: Two times a day (BID) | ORAL | 2 refills | Status: DC | PRN

## 2020-03-09 MED ORDER — METHYLPHENIDATE HCL 10 MG PO TABS: 10.0000 mg | ORAL_TABLET | Freq: Two times a day (BID) | ORAL | 0 refills | Status: DC

## 2020-03-09 MED ORDER — ARIPIPRAZOLE 5 MG PO TABS: ORAL_TABLET | ORAL | 2 refills | Status: DC
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
## 2020-03-11 LAB — URINE CYTOLOGY ANCILLARY ONLY
Bacterial Vaginitis-Urine: NEGATIVE
Candida Urine: NEGATIVE
Chlamydia: NEGATIVE
Comment: NEGATIVE
Comment: NEGATIVE
Comment: NORMAL
Neisseria Gonorrhea: NEGATIVE
Trichomonas: NEGATIVE

## 2020-03-11 NOTE — Progress Notes (Signed)
Negative gonorrhea, chlamydia  and trichomonas in urine, if concerned would advise a cervical cytology follow up.

## 2020-03-13 LAB — URINE CULTURE

## 2020-03-16 NOTE — Progress Notes (Signed)
E coli in urine, Cipro she is on should cover. Return to office if any symptoms persist at anytime.

## 2020-03-26 ENCOUNTER — Encounter: Payer: Self-pay | Admitting: Adult Health

## 2020-05-04 ENCOUNTER — Ambulatory Visit: Payer: No Typology Code available for payment source | Admitting: Family Medicine

## 2020-05-06 ENCOUNTER — Ambulatory Visit: Payer: Self-pay | Admitting: Family Medicine

## 2020-05-06 NOTE — Progress Notes (Deleted)
      Established patient visit   Patient: Cynthia Castaneda   DOB: May 08, 1995   24 y.o. Female  MRN: [id number] Visit Date: 05/06/2020  Today's healthcare provider: Mila Merry, MD   No chief complaint on file.  Subjective    HPI  STD testing: Patient is here today requesting testing for STD's.   {Show patient history (optional):23778::" "}   Medications: Outpatient Medications Prior to Visit  Medication Sig  . ARIPiprazole (ABILIFY) 5 MG tablet TAKE 1 TABLET BY MOUTH EVERYDAY AT BEDTIME  . LORazepam (ATIVAN) 0.5 MG tablet Take 1 tablet (0.5 mg total) by mouth 2 (two) times daily as needed for anxiety.  . melatonin 5 MG TABS Take 1 tablet (5 mg total) by mouth at bedtime.  . methylphenidate (RITALIN) 10 MG tablet Take 1 tablet (10 mg total) by mouth 2 (two) times daily.  . methylphenidate (RITALIN) 10 MG tablet Take 1 tablet (10 mg total) by mouth 2 (two) times daily.  Melene Muller ON 05/08/2020] methylphenidate (RITALIN) 10 MG tablet Take 1 tablet (10 mg total) by mouth 2 (two) times daily.  Marland Kitchen ofloxacin (FLOXIN) 400 MG tablet Take 1 tablet (400 mg total) by mouth 2 (two) times daily.   No facility-administered medications prior to visit.    Review of Systems  {Labs  Heme  Chem  Endocrine  Serology  Results Review (optional):23779::" "}   Objective    There were no vitals taken for this visit. {Show previous vital signs (optional):23777::" "}   Physical Exam  ***  No results found for any visits on 05/06/20.  Assessment & Plan     ***  No follow-ups on file.      {provider attestation***:1}   Mila Merry, MD  [facility] [phone] (phone) [phone] (fax)  [facility]

## 2020-05-18 ENCOUNTER — Ambulatory Visit (INDEPENDENT_AMBULATORY_CARE_PROVIDER_SITE_OTHER): Payer: BC Managed Care – PPO | Admitting: Family Medicine

## 2020-05-18 ENCOUNTER — Other Ambulatory Visit: Payer: Self-pay

## 2020-05-18 ENCOUNTER — Encounter: Payer: Self-pay | Admitting: Family Medicine

## 2020-05-18 ENCOUNTER — Other Ambulatory Visit (HOSPITAL_COMMUNITY)
Admission: RE | Admit: 2020-05-18 | Discharge: 2020-05-18 | Disposition: A | Discharge status: Home / Self Care | Payer: BC Managed Care – PPO | Source: Ambulatory Visit | Attending: Family Medicine | Admitting: Family Medicine

## 2020-05-18 VITALS — BP 107/71 | HR 76 | Temp 97.7°F | Resp 16 | Wt 125.4 lb

## 2020-05-18 DIAGNOSIS — Z113 Encounter for screening for infections with a predominantly sexual mode of transmission: Secondary | ICD-10-CM | POA: Diagnosis not present

## 2020-05-18 DIAGNOSIS — R300 Dysuria: Secondary | ICD-10-CM | POA: Insufficient documentation

## 2020-05-18 LAB — POCT URINALYSIS DIPSTICK
Blood, UA: NEGATIVE
Glucose, UA: NEGATIVE
Ketones, UA: NEGATIVE
Leukocytes, UA: NEGATIVE
Nitrite, UA: NEGATIVE
Protein, UA: NEGATIVE
Spec Grav, UA: >=1.03 — AB (ref 1.010–1.025)
Urobilinogen, UA: 0.2 E.U./dL
pH, UA: 6 (ref 5.0–8.0)

## 2020-05-18 NOTE — Progress Notes (Signed)
Established patient visit   Patient: Cynthia Castaneda   DOB: January 03, 1996   25 y.o. Female  MRN: [id number] Visit Date: 05/18/2020  Today's healthcare provider: Mila Merry, MD    Chief Complaint  Patient presents with  . Dysuria   Subjective    Dysuria  This is a chronic problem. The current episode started more than 1 year ago. The problem occurs every urination. The problem has been unchanged. The quality of the pain is described as burning. There has been no fever. Associated symptoms include urgency. Pertinent negatives include no chills, nausea or vomiting. She has tried nothing for the symptoms. Her past medical history is significant for recurrent UTIs.    She has had urology evaluation in the past for chronic UTIs and she report nothing abnormal was found. She does request STD screening, but denies and known exposures or symptoms.      Medications: Outpatient Medications Prior to Visit  Medication Sig  . ARIPiprazole (ABILIFY) 5 MG tablet TAKE 1 TABLET BY MOUTH EVERYDAY AT BEDTIME  . LORazepam (ATIVAN) 0.5 MG tablet Take 1 tablet (0.5 mg total) by mouth 2 (two) times daily as needed for anxiety.  . melatonin 5 MG TABS Take 1 tablet (5 mg total) by mouth at bedtime.  . methylphenidate (RITALIN) 10 MG tablet Take 1 tablet (10 mg total) by mouth 2 (two) times daily.  . methylphenidate (RITALIN) 10 MG tablet Take 1 tablet (10 mg total) by mouth 2 (two) times daily.  . methylphenidate (RITALIN) 10 MG tablet Take 1 tablet (10 mg total) by mouth 2 (two) times daily.  . [DISCONTINUED] ofloxacin (FLOXIN) 400 MG tablet Take 1 tablet (400 mg total) by mouth 2 (two) times daily. (Patient not taking: Reported on 05/18/2020)   No facility-administered medications prior to visit.    Review of Systems  Constitutional: Negative for appetite change, chills, fatigue and fever.  Respiratory: Negative for chest tightness and shortness of breath.   Cardiovascular: Negative for chest  pain and palpitations.  Gastrointestinal: Negative for abdominal pain, nausea and vomiting.  Genitourinary: Positive for dysuria and urgency.  Neurological: Negative for dizziness and weakness.       Objective    BP 107/71 (BP Location: Left Arm, Patient Position: Sitting)   Pulse 76   Temp 97.7 F (36.5 C) (Temporal)   Resp 16   Wt 125 lb 6.4 oz (56.9 kg)   BMI 22.21 kg/m     Physical Exam   General appearance: Well developed, well nourished female, cooperative and in no acute distress Head: Normocephalic, without obvious abnormality, atraumatic Respiratory: Respirations even and unlabored, normal respiratory rate Extremities: All extremities are intact.  Skin: Skin color, texture, turgor normal. No rashes seen  Psych: Appropriate mood and affect. Neurologic: Mental status: Alert, oriented to person, place, and time, thought content appropriate.   Results for orders placed or performed in visit on 05/18/20  POCT Urinalysis Dipstick  Result Value Ref Range   Color, UA amber    Clarity, UA clear    Glucose, UA Negative Negative   Bilirubin, UA small    Ketones, UA negative    Spec Grav, UA >=1.030 (A) 1.010 - 1.025   Blood, UA negative    pH, UA 6.0 5.0 - 8.0   Protein, UA Negative Negative   Urobilinogen, UA 0.2 0.2 or 1.0 E.U./dL   Nitrite, UA negative    Leukocytes, UA Negative Negative   Appearance     Odor  Assessment & Plan     1. Dysuria  - Urine cytology ancillary only  2. Screening for STD (sexually transmitted disease) No known exposures, asymtpomatic.  - Urine cytology ancillary only  Recommended HIV, hep C,and RPR which she declined.  Declined flu vaccine.        The entirety of the information documented in the History of Present Illness, Review of Systems and Physical Exam were personally obtained by me. Portions of this information were initially documented by the CMA and reviewed by me for thoroughness and accuracy.      Mila Merry, MD  [facility] [phone] (phone) [phone] (fax)  [facility]

## 2020-05-19 LAB — URINE CYTOLOGY ANCILLARY ONLY
Bacterial Vaginitis-Urine: NEGATIVE
Candida Urine: NEGATIVE
Chlamydia: NEGATIVE
Comment: NEGATIVE
Comment: NEGATIVE
Comment: NORMAL
Neisseria Gonorrhea: NEGATIVE
Trichomonas: NEGATIVE

## 2020-06-08 ENCOUNTER — Ambulatory Visit: Payer: Self-pay | Admitting: Psychiatry

## 2020-07-12 DIAGNOSIS — J069 Acute upper respiratory infection, unspecified: Secondary | ICD-10-CM | POA: Diagnosis not present

## 2020-07-12 DIAGNOSIS — R509 Fever, unspecified: Secondary | ICD-10-CM | POA: Diagnosis not present

## 2020-07-24 ENCOUNTER — Encounter: Payer: Self-pay | Admitting: Psychiatry

## 2020-07-24 ENCOUNTER — Other Ambulatory Visit: Payer: Self-pay

## 2020-07-24 ENCOUNTER — Ambulatory Visit (INDEPENDENT_AMBULATORY_CARE_PROVIDER_SITE_OTHER): Payer: BC Managed Care – PPO | Admitting: Psychiatry

## 2020-07-24 DIAGNOSIS — F902 Attention-deficit hyperactivity disorder, combined type: Secondary | ICD-10-CM | POA: Diagnosis not present

## 2020-07-24 DIAGNOSIS — F422 Mixed obsessional thoughts and acts: Secondary | ICD-10-CM

## 2020-07-24 DIAGNOSIS — F330 Major depressive disorder, recurrent, mild: Secondary | ICD-10-CM

## 2020-07-24 DIAGNOSIS — F4312 Post-traumatic stress disorder, chronic: Secondary | ICD-10-CM

## 2020-07-24 MED ORDER — VORTIOXETINE HBR 5 MG PO TABS: ORAL_TABLET | ORAL | 0 refills | Status: DC

## 2020-07-24 MED ORDER — VORTIOXETINE HBR 10 MG PO TABS
10.0000 mg | ORAL_TABLET | Freq: Every day | ORAL | 1 refills | Status: DC
Start: 2020-07-24 — End: 2020-09-10

## 2020-07-24 MED ORDER — ARIPIPRAZOLE 5 MG PO TABS: 2.5000 mg | ORAL_TABLET | Freq: Every day | ORAL | 0 refills | Status: DC

## 2020-07-24 NOTE — Progress Notes (Signed)
   07/24/20 1336  Facial and Oral Movements  Muscles of Facial Expression 0  Lips and Perioral Area 0  Jaw 0  Tongue 0  Extremity Movements  Upper (arms, wrists, hands, fingers) 0  Lower (legs, knees, ankles, toes) 0  Trunk Movements  Neck, shoulders, hips 0  Overall Severity  Severity of abnormal movements (highest score from questions above) 0  Incapacitation due to abnormal movements 0  Patient's awareness of abnormal movements (rate only patient's report) 0  AIMS Total Score  AIMS Total Score 0
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
## 2020-09-10 ENCOUNTER — Encounter: Payer: Self-pay | Admitting: Psychiatry

## 2020-09-10 ENCOUNTER — Ambulatory Visit (INDEPENDENT_AMBULATORY_CARE_PROVIDER_SITE_OTHER): Payer: BC Managed Care – PPO | Admitting: Psychiatry

## 2020-09-10 ENCOUNTER — Other Ambulatory Visit: Payer: Self-pay

## 2020-09-10 DIAGNOSIS — F5082 Avoidant/restrictive food intake disorder: Secondary | ICD-10-CM

## 2020-09-10 DIAGNOSIS — F330 Major depressive disorder, recurrent, mild: Secondary | ICD-10-CM | POA: Diagnosis not present

## 2020-09-10 DIAGNOSIS — F422 Mixed obsessional thoughts and acts: Secondary | ICD-10-CM

## 2020-09-10 DIAGNOSIS — F4312 Post-traumatic stress disorder, chronic: Secondary | ICD-10-CM | POA: Diagnosis not present

## 2020-09-10 DIAGNOSIS — F902 Attention-deficit hyperactivity disorder, combined type: Secondary | ICD-10-CM | POA: Diagnosis not present

## 2020-09-10 MED ORDER — LAMOTRIGINE 25 MG PO TABS: ORAL_TABLET | ORAL | 1 refills | Status: DC

## 2020-09-10 MED ORDER — LORAZEPAM 0.5 MG PO TABS: 0.5000 mg | ORAL_TABLET | Freq: Two times a day (BID) | ORAL | 0 refills | Status: DC | PRN

## 2020-09-10 MED ORDER — ARIPIPRAZOLE 5 MG PO TABS: 2.5000 mg | ORAL_TABLET | Freq: Every day | ORAL | 0 refills | Status: DC

## 2020-09-10 NOTE — Progress Notes (Signed)
Cynthia Castaneda [id number] 1995-05-07 25 y.o.  Subjective:   Patient ID:  Cynthia Castaneda is a 25 y.o. (DOB [birth year]) female.  Chief Complaint:  Chief Complaint  Patient presents with  . Anxiety  . Depression    HPI Cynthia Castaneda presents to the office today for follow-up of anxiety and depression. She is accompanied by her mother. She reports that she was not able to tolerate Trintellix due to GI s/s and felt "drunk and loopy."  She took one dose and decided not to continue it. She reports that her her mood has been depressed- "but that's pretty normal for me." Shereports that her anxiety has been ok. She reports that she has had a few panic attacks and prn medication was helpful for this.   She reports that she had 1-2 incidents of VH- once was in her peripheral vision and other time it was more like an intrusive thought. She reports VH used to be daily in the past. She reports intrusive thoughts are less compared to the past. She reports that she has been having nightmares and needing to take Melatonin. She attributes sleep difficulties to stress. Her appetite fluctuates and she has been trying to lose weight. She reports that she has been gaining weight and this causes her some distress. She reports that she is eating an adequate amount. Notices some recurrence of eating disordered thinking. She reports low energy and motivation. She reports difficulty with concentration and tries to focus on certain things to distract herself from anxious or negative thoughts. Takes Ritalin prn when she is at work. Denies SI.   Taking Ativan prn about once a week.   Her work hours were reduced and she has minimal income. Her lease ends in June ans she is looking for a new place to live.   Mother has intrusive thoughts and memory difficulties. Mother also has h/o medication sensitivity  Past Psychiatric Medication Trials: Ritalin Concerta   Celexa BuSpar Prozac-SI Zoloft-SI Lexapro Pristiq-Helped Cymbalta-Helped. Does not recall side effects Trintellix- "Felt drunk" and had n/v Pamelor Neurontin- Helpful for a year and then caused nausea Abilify- Unable to tolerate doses higher than 2.5 mg. Had dizziness and felt as if she could pass out with 5 mg.  Lamictal- May have helped for awhile and then no longer as effective Lorazepam- More effective than Klonopin Klonopin  AIMS   Flowsheet Row Office Visit from 07/24/2020 in [facility] Admission (Discharged) from 07/13/2019 in [facility] INPATIENT BEHAVIORAL MEDICINE  AIMS Total Score 0 0    AUDIT   Flowsheet Row Admission (Discharged) from 07/13/2019 in [facility] INPATIENT BEHAVIORAL MEDICINE  Alcohol Use Disorder Identification Test Final Score (AUDIT) 0    PHQ2-9   Flowsheet Row Office Visit from 01/06/2020 in [facility] Office Visit from 11/03/2016 in [facility]  PHQ-2 Total Score 5 5  PHQ-9 Total Score 22 23    Flowsheet Row Admission (Discharged) from 07/13/2019 in [facility] INPATIENT BEHAVIORAL MEDICINE Most recent reading at 07/13/2019  2:00 PM ED from 07/13/2019 in [facility] EMERGENCY DEPARTMENT Most recent reading at 07/13/2019  4:44 AM  C-SSRS RISK CATEGORY High Risk High Risk       Review of Systems:  Review of Systems  Gastrointestinal: Negative.   Musculoskeletal: Positive for back pain. Negative for gait problem.  Neurological: Negative for tremors.  Psychiatric/Behavioral:       Please refer to HPI    Medications: I have reviewed the patient's current medications.  Current Outpatient Medications  Medication Sig Dispense Refill  . lamoTRIgine (LAMICTAL) 25 MG tablet Take 1 tablet (25 mg total) by mouth daily for 14 days, THEN 2 tablets (50 mg total) daily for 14 days. 60 tablet 1  . melatonin 5 MG TABS Take 1 tablet (5 mg total) by mouth at bedtime. (Patient taking differently: Take 5 mg by mouth  at bedtime as needed.) 30 tablet 1  . ARIPiprazole (ABILIFY) 5 MG tablet Take 0.5 tablets (2.5 mg total) by mouth daily. TAKE 1 TABLET BY MOUTH EVERYDAY AT BEDTIME 45 tablet 0  . LORazepam (ATIVAN) 0.5 MG tablet Take 1 tablet (0.5 mg total) by mouth 2 (two) times daily as needed for anxiety. 60 tablet 0  . methylphenidate (RITALIN) 10 MG tablet Take 1 tablet (10 mg total) by mouth 2 (two) times daily. 60 tablet 0  . methylphenidate (RITALIN) 10 MG tablet Take 1 tablet (10 mg total) by mouth 2 (two) times daily. 60 tablet 0  . methylphenidate (RITALIN) 10 MG tablet Take 1 tablet (10 mg total) by mouth 2 (two) times daily. 60 tablet 0   No current facility-administered medications for this visit.    Medication Side Effects: Other: Unable to tolerate Trintellix  Allergies:  Allergies  Allergen Reactions  . Amoxicillin Other (See Comments)    Unknown childhood allergy UNSURE    Past Medical History:  Diagnosis Date  . ADHD (attention deficit hyperactivity disorder)   . Allergy   . Anxiety   . Borderline personality disorder (HCC)   . Depression   . Eating disorder   . Frequent headaches   . Migraine   . OCD (obsessive compulsive disorder)   . PTSD (post-traumatic stress disorder)     Past Medical History, Surgical history, Social history, and Family history were reviewed and updated as appropriate.   Please see review of systems for further details on the patient's review from today.   Objective:   Physical Exam:  There were no vitals taken for this visit.  Physical Exam  Lab Review:  No results found for: NA, K, CL, CO2, GLUCOSE, BUN, CREATININE, CALCIUM, PROT, ALBUMIN, AST, ALT, ALKPHOS, BILITOT, GFRNONAA, GFRAA  No results found for: WBC, RBC, HGB, HCT, PLT, MCV, MCH, MCHC, RDW, LYMPHSABS, MONOABS, EOSABS, BASOSABS  No results found for: POCLITH, LITHIUM   No results found for: PHENYTOIN, PHENOBARB, VALPROATE, CBMZ   .res Assessment: Plan:   Patient seen for 30  minutes and time spent counseling patient and her mother regarding several possible treatment options.  Discussed potential benefits, risks, and side effects of lamotrigine, gabapentin, and Cymbalta. Counseled patient regarding potential benefits, risks, and side effects of Lamictal to include potential risk of Stevens-Johnson syndrome. Advised patient to stop taking Lamictal and contact office immediately if rash develops and to seek urgent medical attention if rash is severe and/or spreading quickly. Patient reports that she would like to restart lamotrigine.Will start Lamictal 25 mg daily for 2 weeks, then increase to 50 mg daily for mood symptoms.  Continue Abilify 5 mg 1/2 tablet daily for mood signs and symptoms. Continue lorazepam 0.5 mg twice daily as needed for anxiety. Continue Ritalin 10 mg twice daily for attention deficit disorder. Pt plans to start therapy.  Patient to follow-up with this provider in approximately 6 weeks or sooner if clinically indicated. Patient advised to contact office with any questions, adverse effects, or acute worsening in signs and symptoms.    Auriel was seen today for anxiety and depression.  Diagnoses and all orders for this visit:  Chronic post-traumatic stress disorder (PTSD) -     ARIPiprazole (ABILIFY) 5 MG tablet; Take 0.5 tablets (2.5 mg total) by mouth daily. TAKE 1 TABLET BY MOUTH EVERYDAY AT BEDTIME -     LORazepam (ATIVAN) 0.5 MG tablet; Take 1 tablet (0.5 mg total) by mouth 2 (two) times daily as needed for anxiety.  Mixed obsessional thoughts and acts -     ARIPiprazole (ABILIFY) 5 MG tablet; Take 0.5 tablets (2.5 mg total) by mouth daily. TAKE 1 TABLET BY MOUTH EVERYDAY AT BEDTIME -     LORazepam (ATIVAN) 0.5 MG tablet; Take 1 tablet (0.5 mg total) by mouth 2 (two) times daily as needed for anxiety.  Attention deficit hyperactivity disorder (ADHD), combined type, moderate -     ARIPiprazole (ABILIFY) 5 MG tablet; Take 0.5 tablets (2.5 mg  total) by mouth daily. TAKE 1 TABLET BY MOUTH EVERYDAY AT BEDTIME  Mild recurrent major depression (HCC) -     lamoTRIgine (LAMICTAL) 25 MG tablet; Take 1 tablet (25 mg total) by mouth daily for 14 days, THEN 2 tablets (50 mg total) daily for 14 days. -     ARIPiprazole (ABILIFY) 5 MG tablet; Take 0.5 tablets (2.5 mg total) by mouth daily. TAKE 1 TABLET BY MOUTH EVERYDAY AT BEDTIME  Avoidant-restrictive food intake disorder (ARFID) -     LORazepam (ATIVAN) 0.5 MG tablet; Take 1 tablet (0.5 mg total) by mouth 2 (two) times daily as needed for anxiety.     Please see After Visit Summary for patient specific instructions.  Future Appointments  Date Time Provider Department Center  10/22/2020  9:00 AM Corie Chiquito, PMHNP [facility] None    No orders of the defined types were placed in this encounter.   -------------------------------

## 2020-10-22 ENCOUNTER — Telehealth: Payer: BC Managed Care – PPO | Admitting: Psychiatry

## 2020-10-22 DIAGNOSIS — Z8659 Personal history of other mental and behavioral disorders: Secondary | ICD-10-CM

## 2020-10-31 NOTE — Progress Notes (Signed)
Unable to reach pt for scheduled video visit by phone of video.

## 2020-11-25 ENCOUNTER — Telehealth (INDEPENDENT_AMBULATORY_CARE_PROVIDER_SITE_OTHER): Payer: BC Managed Care – PPO | Admitting: Psychiatry

## 2020-11-25 ENCOUNTER — Encounter: Payer: Self-pay | Admitting: Psychiatry

## 2020-11-25 DIAGNOSIS — F4312 Post-traumatic stress disorder, chronic: Secondary | ICD-10-CM | POA: Diagnosis not present

## 2020-11-25 DIAGNOSIS — F422 Mixed obsessional thoughts and acts: Secondary | ICD-10-CM

## 2020-11-25 DIAGNOSIS — F330 Major depressive disorder, recurrent, mild: Secondary | ICD-10-CM

## 2020-11-25 DIAGNOSIS — F902 Attention-deficit hyperactivity disorder, combined type: Secondary | ICD-10-CM | POA: Diagnosis not present

## 2020-11-25 MED ORDER — METHYLPHENIDATE HCL 10 MG PO TABS: 10.0000 mg | ORAL_TABLET | Freq: Two times a day (BID) | ORAL | 0 refills | Status: DC

## 2020-11-25 MED ORDER — ARIPIPRAZOLE 5 MG PO TABS: 5.0000 mg | ORAL_TABLET | Freq: Every day | ORAL | 0 refills | Status: DC

## 2020-11-25 MED ORDER — LAMOTRIGINE 25 MG PO TABS: 25.0000 mg | ORAL_TABLET | Freq: Every day | ORAL | 0 refills | Status: DC

## 2020-11-25 NOTE — Progress Notes (Signed)
Cynthia Castaneda [id number] 06-21-1995 24 y.o.  Virtual Visit via Video Note  I connected with pt @ on 11/25/20 at  8:30 AM EDT by a video enabled telemedicine application and verified that I am speaking with the correct person using two identifiers.   I discussed the limitations of evaluation and management by telemedicine and the availability of in person appointments. The patient expressed understanding and agreed to proceed.  I discussed the assessment and treatment plan with the patient. The patient was provided an opportunity to ask questions and all were answered. The patient agreed with the plan and demonstrated an understanding of the instructions.   The patient was advised to call back or seek an in-person evaluation if the symptoms worsen or if the condition fails to improve as anticipated.  I provided 25 minutes of non-face-to-face time during this encounter.  The patient was located at home.  The provider was located at [facility].   Corie Chiquito, PMHNP   Subjective:   Patient ID:  Cynthia Castaneda is a 25 y.o. (DOB 03/26/1996) female.  Chief Complaint:  Chief Complaint  Patient presents with   Anxiety   Depression   Sleeping Problem    HPI Cynthia Castaneda presents for follow-up of anxiety, depression, and sleep disturbance. She reports that she had a summer job at a camp and "it didn't go so well." She reports that there was an incident that triggered anxiety and she decided to quit. She reports that she feels "disconnected from reality... senses become separated from each other" since she had a negative experience on Halloween 2020. She reports that these experiences trigger panic attacks. She reports panic attacks 1-2 times a week. Denies re-experiencing. She reports anxious thoughts. Nightmares about intrusive thoughts. Intrusive thoughts have been increased. Intrusive thoughts are related to "something I can't control happening to other people."    She reports that she has not noticed any improvement since starting Lamictal and "kind of felt worse since starting it." Has had increased depression. She reports irritability and states she has been easier to anger. Difficulty falling and staying asleep. She reports anxiety around going to sleep and "fear of losing consciousness." Estimates sleeping about 8 hours a night. Appetite has been ok. Reports that she has decided to start "calorie counting" and has asked her boyfriend to help with this to ensure she is not restricting excessively. She reports "no motivation to do anything other than my job." She reports concentration has been impaired since she has not been taking Ritalin and reports that she has misplaced it. Denies SI. She reports that she has had some thoughts of self-harm.   She reports that she was hired for a job at [employer]. 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   She reports that she took Abilify 5 mg x1 and had some possible side effects that she now thinks may have been unrelated to medication.   Used Lorazepam prn during summer job. Has not needed Lorazepam recently.   Past Psychiatric Medication Trials: Ritalin Concerta Celexa BuSpar Prozac-SI Zoloft-SI Lexapro Pristiq-Helped Cymbalta-Helped. Does not recall side effects Trintellix- "Felt drunk" and had n/v Pamelor Neurontin- Helpful for a year and then caused nausea Abilify- Unable to tolerate doses higher than 2.5 mg. Had dizziness and felt as if she could pass out with 5 mg.  Lamictal- May have helped for awhile and then no longer as effective Lorazepam- More effective than Klonopin Klonopin  Review of Systems:  Review  of Systems  Genitourinary:        Recent change in menstrual cycle with period being 3 weeks later than expected.   Musculoskeletal:  Negative for gait problem.  Skin:  Negative for rash.  Psychiatric/Behavioral:         Please refer to HPI   Medications: I  have reviewed the patient's current medications.  Current Outpatient Medications  Medication Sig Dispense Refill   LORazepam (ATIVAN) 0.5 MG tablet Take 1 tablet (0.5 mg total) by mouth 2 (two) times daily as needed for anxiety. 60 tablet 0   melatonin 5 MG TABS Take 1 tablet (5 mg total) by mouth at bedtime. (Patient taking differently: Take 5 mg by mouth at bedtime as needed.) 30 tablet 1   ARIPiprazole (ABILIFY) 5 MG tablet Take 1 tablet (5 mg total) by mouth daily. TAKE 1 TABLET BY MOUTH EVERYDAY AT BEDTIME 90 tablet 0   lamoTRIgine (LAMICTAL) 25 MG tablet Take 1 tablet (25 mg total) by mouth daily. 90 tablet 0   [START ON 01/20/2021] methylphenidate (RITALIN) 10 MG tablet Take 1 tablet (10 mg total) by mouth 2 (two) times daily. 60 tablet 0   [START ON 12/23/2020] methylphenidate (RITALIN) 10 MG tablet Take 1 tablet (10 mg total) by mouth 2 (two) times daily. 60 tablet 0   methylphenidate (RITALIN) 10 MG tablet Take 1 tablet (10 mg total) by mouth 2 (two) times daily. 60 tablet 0   No current facility-administered medications for this visit.    Medication Side Effects: None  Allergies:  Allergies  Allergen Reactions   Amoxicillin Other (See Comments)    Unknown childhood allergy UNSURE    Past Medical History:  Diagnosis Date   ADHD (attention deficit hyperactivity disorder)    Allergy    Anxiety    Borderline personality disorder (HCC)    Depression    Eating disorder    Frequent headaches    Migraine    OCD (obsessive compulsive disorder)    PTSD (post-traumatic stress disorder)     Family History  Problem Relation Age of Onset   Depression Mother    Mental illness Mother    OCD Mother    Memory loss Mother    Depression Father    Mental illness Father    Depression Brother    Mental illness Brother    OCD Maternal Grandmother    OCD Maternal Aunt    Bladder Cancer Neg Hx    Kidney cancer Neg Hx     Social History   Socioeconomic History   Marital status:  Single    Spouse name: Not on file   Number of children: Not on file   Years of education: Not on file   Highest education level: Not on file  Occupational History   Not on file  Tobacco Use   Smoking status: Never   Smokeless tobacco: Never  Vaping Use   Vaping Use: Never used  Substance and Sexual Activity   Alcohol use: Yes   Drug use: No   Sexual activity: Not on file  Other Topics Concern   Not on file  Social History Narrative   Not on file   Social Determinants of Health   Financial Resource Strain: Not on file  Food Insecurity: Not on file  Transportation Needs: Not on file  Physical Activity: Not on file  Stress: Not on file  Social Connections: Not on file  Intimate Partner Violence: Not on file    Past Medical  History, Surgical history, Social history, and Family history were reviewed and updated as appropriate.   Please see review of systems for further details on the patient's review from today.   Objective:   Physical Exam:  There were no vitals taken for this visit.  Physical Exam Constitutional:      General: She is not in acute distress. Musculoskeletal:        General: No deformity.  Neurological:     Mental Status: She is alert and oriented to person, place, and time.     Coordination: Coordination normal.  Psychiatric:        Attention and Perception: Attention and perception normal. She does not perceive auditory or visual hallucinations.        Mood and Affect: Mood is anxious and depressed. Affect is not labile, blunt, angry or inappropriate.        Speech: Speech normal.        Behavior: Behavior normal.        Thought Content: Thought content normal. Thought content is not paranoid or delusional. Thought content does not include homicidal or suicidal ideation. Thought content does not include homicidal or suicidal plan.        Cognition and Memory: Cognition and memory normal.        Judgment: Judgment normal.     Comments: Insight  intact    Lab Review:  No results found for: NA, K, CL, CO2, GLUCOSE, BUN, CREATININE, CALCIUM, PROT, ALBUMIN, AST, ALT, ALKPHOS, BILITOT, GFRNONAA, GFRAA  No results found for: WBC, RBC, HGB, HCT, PLT, MCV, MCH, MCHC, RDW, LYMPHSABS, MONOABS, EOSABS, BASOSABS  No results found for: POCLITH, LITHIUM   No results found for: PHENYTOIN, PHENOBARB, VALPROATE, CBMZ   .res Assessment: Plan:    30 minutes spent with patient and with documentation. Reviewed recent events and triggers to anxiety. Also discussed response to Lamictal. Discussed that she is currently on a low dose and this may not be therapeutic. She reports that she would like to try increase in Abilify to 5 mg to improve mood and intrusive thoughts since she took 5 mg dose only once and now attributes possible side effects to cold s/s that may have coincided with increase in Abilify. Will increase Abilify to 5 mg and she reports that she will reduce Abilify back to 1/2 tablet if adverse effects occur. She reports that she would like to continue current dose of Lamictal at this time while monitoring response to increase in Lamictal. Will continue Lamictal 25 mg po qd.  She declines medication for treatment of insomnia at this time.  Will resume Ritalin 10 mg po BID for ADHD since she will be starting a new job.  Continue Lorazepam prn anxiety. Script was sent 09/10/20 and based on PDMP this has not yet been filled. Advised pt that she should still have a script on file.  Pt to follow-up in 3 months or sooner if clinically indicated.  Patient advised to contact office with any questions, adverse effects, or acute worsening in signs and symptoms.   Cynthia Castaneda was seen today for anxiety, depression and sleeping problem.  Diagnoses and all orders for this visit:  Chronic post-traumatic stress disorder (PTSD) -     ARIPiprazole (ABILIFY) 5 MG tablet; Take 1 tablet (5 mg total) by mouth daily. TAKE 1 TABLET BY MOUTH EVERYDAY AT BEDTIME  Mixed  obsessional thoughts and acts -     ARIPiprazole (ABILIFY) 5 MG tablet; Take 1 tablet (5 mg total) by  mouth daily. TAKE 1 TABLET BY MOUTH EVERYDAY AT BEDTIME  Attention deficit hyperactivity disorder (ADHD), combined type, moderate -     ARIPiprazole (ABILIFY) 5 MG tablet; Take 1 tablet (5 mg total) by mouth daily. TAKE 1 TABLET BY MOUTH EVERYDAY AT BEDTIME -     methylphenidate (RITALIN) 10 MG tablet; Take 1 tablet (10 mg total) by mouth 2 (two) times daily. -     methylphenidate (RITALIN) 10 MG tablet; Take 1 tablet (10 mg total) by mouth 2 (two) times daily.  Mild recurrent major depression (HCC) -     ARIPiprazole (ABILIFY) 5 MG tablet; Take 1 tablet (5 mg total) by mouth daily. TAKE 1 TABLET BY MOUTH EVERYDAY AT BEDTIME -     lamoTRIgine (LAMICTAL) 25 MG tablet; Take 1 tablet (25 mg total) by mouth daily. -     methylphenidate (RITALIN) 10 MG tablet; Take 1 tablet (10 mg total) by mouth 2 (two) times daily. -     methylphenidate (RITALIN) 10 MG tablet; Take 1 tablet (10 mg total) by mouth 2 (two) times daily.    Please see After Visit Summary for patient specific instructions.  No future appointments.   No orders of the defined types were placed in this encounter.     -------------------------------

## 2020-12-01 ENCOUNTER — Telehealth: Payer: Self-pay

## 2020-12-01 NOTE — Telephone Encounter (Signed)
I returned patient's call. Patient was confused and stated "I didn't call [facility] today". Patient didn't have any concerns to discuss during the phone call and was unsure why I called her. I apologized to patient for calling.

## 2020-12-01 NOTE — Telephone Encounter (Signed)
Copied from CRM [id number]. Topic: General - Inquiry >> Dec 01, 2020  2:38 PM Cynthia Castaneda wrote: Reason for CRM: Call back  [phone] pt wants a fu call from Dr Carmon Ginsberg nurse, has a sore throat with white patches on one side is quite sure not covid as no other symptoms, tried to get a rapid strep at [facility] and sold out. Wants a cb if [facility] could let her come by for a test. Does not want appt FU

## 2021-04-05 ENCOUNTER — Encounter: Payer: Self-pay | Admitting: Emergency Medicine

## 2021-04-05 ENCOUNTER — Other Ambulatory Visit: Payer: Self-pay

## 2021-04-05 ENCOUNTER — Ambulatory Visit
Admission: EM | Admit: 2021-04-05 | Discharge: 2021-04-05 | Disposition: A | Discharge status: Home / Self Care | Payer: BC Managed Care – PPO | Attending: Family Medicine | Admitting: Family Medicine

## 2021-04-05 ENCOUNTER — Ambulatory Visit: Payer: Self-pay

## 2021-04-05 DIAGNOSIS — J101 Influenza due to other identified influenza virus with other respiratory manifestations: Secondary | ICD-10-CM

## 2021-04-05 LAB — POCT INFLUENZA A/B
Influenza A, POC: POSITIVE — AB
Influenza B, POC: NEGATIVE

## 2021-04-05 MED ORDER — ALBUTEROL SULFATE HFA 108 (90 BASE) MCG/ACT IN AERS
2.0000 | INHALATION_SPRAY | Freq: Once | RESPIRATORY_TRACT | Status: AC
  Administered 2021-04-05: 12:00:00 2 via RESPIRATORY_TRACT

## 2021-04-05 MED ORDER — PROMETHAZINE-DM 6.25-15 MG/5ML PO SYRP: 5.0000 mL | ORAL_SOLUTION | Freq: Four times a day (QID) | ORAL | 0 refills | Status: DC | PRN

## 2021-04-05 MED ORDER — OSELTAMIVIR PHOSPHATE 75 MG PO CAPS: 75.0000 mg | ORAL_CAPSULE | Freq: Two times a day (BID) | ORAL | 0 refills | Status: DC

## 2021-04-05 NOTE — Discharge Instructions (Addendum)
Influenza test is positive Continue to alternate Tylenol and ibuprofen for management of fever. Force fluids to maintain hydration. Tamiflu once daily for the next 5 days to reduce symptoms and course of influenza virus.  If you develop any shortness of breath, wheezing or difficulty breathing go immediately to the nearest emergency department.

## 2021-04-05 NOTE — Telephone Encounter (Signed)
Pt's mother called, she states that pt has already been seen at walk in clinic and was positive for the FLU. Pt was prescribed Tamiflu and inhaler. Mother states she has talked to daughter since and she is home resting. Advised her if pt doesn't improve in a couple of days to call back and we can schedule appt if needed. She verbalized understanding.

## 2021-04-05 NOTE — ED Triage Notes (Signed)
Pt here with flu-like sx and high fever x 3 days.

## 2021-04-05 NOTE — ED Provider Notes (Signed)
Cynthia Castaneda    CSN: [id number] Arrival date & time: 04/05/21  1036      History   Chief Complaint Chief Complaint  Patient presents with   Fever   URI    HPI Cynthia Castaneda is a 25 y.o. female.   HPI Patient presents today with 3 days of flulike symptoms including headache, congestion, sore throat, generalized body aches, and fever.  She is unaware if she has been contact with anyone who was tested positive for flu however her boyfriend has had a similar course of symptoms.  She also endorses some mild shortness of breath has no known history of asthma or recurrent bronchitis. She denies any other symptoms. Past Medical History:  Diagnosis Date   ADHD (attention deficit hyperactivity disorder)    Allergy    Anxiety    Borderline personality disorder (HCC)    Depression    Eating disorder    Frequent headaches    Migraine    OCD (obsessive compulsive disorder)    PTSD (post-traumatic stress disorder)     Patient Active Problem List   Diagnosis Date Noted   High risk sexual behavior 03/09/2020   Acute cystitis with hematuria 03/09/2020   Dysuria 03/09/2020   Viral upper respiratory tract infection 03/04/2020   Allergic rhinitis due to allergen 03/04/2020   Mild recurrent major depression (HCC) 01/31/2019   Chronic post-traumatic stress disorder (PTSD) 07/17/2018   Attention deficit hyperactivity disorder (ADHD), combined type, moderate 07/17/2018   Obsessive compulsive disorder 07/17/2018   Avoidant-restrictive food intake disorder (ARFID) 07/17/2018    History reviewed. No pertinent surgical history.  OB History   No obstetric history on file.      Home Medications    Prior to Admission medications   Medication Sig Start Date End Date Taking? Authorizing Provider  oseltamivir (TAMIFLU) 75 MG capsule Take 1 capsule (75 mg total) by mouth 2 (two) times daily. 04/05/21  Yes Bing Neighbors, FNP  promethazine-dextromethorphan  (PROMETHAZINE-DM) 6.25-15 MG/5ML syrup Take 5 mLs by mouth 4 (four) times daily as needed for cough. 04/05/21  Yes Bing Neighbors, FNP  ARIPiprazole (ABILIFY) 5 MG tablet Take 1 tablet (5 mg total) by mouth daily. TAKE 1 TABLET BY MOUTH EVERYDAY AT BEDTIME 11/25/20 02/23/21  Corie Chiquito, PMHNP  lamoTRIgine (LAMICTAL) 25 MG tablet Take 1 tablet (25 mg total) by mouth daily. 11/25/20   Corie Chiquito, PMHNP  LORazepam (ATIVAN) 0.5 MG tablet Take 1 tablet (0.5 mg total) by mouth 2 (two) times daily as needed for anxiety. 09/10/20   Corie Chiquito, PMHNP  melatonin 5 MG TABS Take 1 tablet (5 mg total) by mouth at bedtime. Patient taking differently: Take 5 mg by mouth at bedtime as needed. 07/18/19   Clapacs, Jackquline Denmark, MD  methylphenidate (RITALIN) 10 MG tablet Take 1 tablet (10 mg total) by mouth 2 (two) times daily. 01/20/21 02/19/21  Corie Chiquito, PMHNP  methylphenidate (RITALIN) 10 MG tablet Take 1 tablet (10 mg total) by mouth 2 (two) times daily. 12/23/20 01/22/21  Corie Chiquito, PMHNP  methylphenidate (RITALIN) 10 MG tablet Take 1 tablet (10 mg total) by mouth 2 (two) times daily. 11/25/20 12/25/20  Corie Chiquito, PMHNP    Family History Family History  Problem Relation Age of Onset   Depression Mother    Mental illness Mother    OCD Mother    Memory loss Mother    Depression Father    Mental illness Father    Depression Brother  Mental illness Brother    OCD Maternal Grandmother    OCD Maternal Aunt    Bladder Cancer Neg Hx    Kidney cancer Neg Hx     Social History Social History   Tobacco Use   Smoking status: Never   Smokeless tobacco: Never  Vaping Use   Vaping Use: Never used  Substance Use Topics   Alcohol use: Yes   Drug use: No     Allergies   Amoxicillin Review of Systems Review of Systems Pertinent negatives listed in HPI  Physical Exam Triage Vital Signs ED Triage Vitals  Enc Vitals Group     BP 04/05/21 1110 116/79     Pulse Rate 04/05/21 1110  (!) 125     Resp 04/05/21 1110 18     Temp 04/05/21 1110 99.5 F (37.5 C)     Temp Source 04/05/21 1110 Oral     SpO2 04/05/21 1110 98 %     Weight --      Height --      Head Circumference --      Peak Flow --      Pain Score 04/05/21 1114 4     Pain Loc --      Pain Edu? --      Excl. in GC? --    No data found.  Updated Vital Signs BP 116/79 (BP Location: Left Arm)    Pulse (!) 125    Temp 99.5 F (37.5 C) (Oral)    Resp 18    SpO2 98%   Visual Acuity Right Eye Distance:   Left Eye Distance:   Bilateral Distance:    Right Eye Near:   Left Eye Near:    Bilateral Near:     Physical Exam Constitutional:      Appearance: She is ill-appearing. She is not toxic-appearing.  HENT:     Head: Normocephalic and atraumatic.     Nose: Congestion present.  Eyes:     Extraocular Movements: Extraocular movements intact.     Pupils: Pupils are equal, round, and reactive to light.  Cardiovascular:     Rate and Rhythm: Regular rhythm. Tachycardia present.  Pulmonary:     Effort: Pulmonary effort is normal.     Breath sounds: Normal breath sounds.  Lymphadenopathy:     Cervical: Cervical adenopathy present.  Skin:    General: Skin is warm and dry.  Neurological:     General: No focal deficit present.     Mental Status: She is alert.  Psychiatric:        Mood and Affect: Mood normal.        Behavior: Behavior normal.        Thought Content: Thought content normal.        Judgment: Judgment normal.     UC Treatments / Results  Labs (all labs ordered are listed, but only abnormal results are displayed) Labs Reviewed  POCT INFLUENZA A/B - Abnormal; Notable for the following components:      Result Value   Influenza A, POC Positive (*)    All other components within normal limits    EKG   Radiology No results found.  Procedures Procedures (including critical care time)  Medications Ordered in UC Medications  albuterol (VENTOLIN HFA) 108 (90 Base) MCG/ACT  inhaler 2 puff (2 puffs Inhalation Given 04/05/21 1133)    Initial Impression / Assessment and Plan / UC Course  I have reviewed the triage vital signs and the nursing  notes.  Pertinent labs & imaging results that were available during my care of the patient were reviewed by me and considered in my medical decision making (see chart for details).    Influenza test is positive 2 puffs of albuterol inhaler given here in clinic as patient complained of shortness of breath.  Patient tolerated once able to perform use of inhaler. Continue to alternate Tylenol and ibuprofen for management of fever. Force fluids to maintain hydration. Tamiflu once daily for the next 5 days to reduce symptoms and course of influenza virus.  If you develop any shortness of breath, wheezing or difficulty breathing go immediately to the nearest emergency department. Final Clinical Impressions(s) / UC Diagnoses   Final diagnoses:  Influenza A     Discharge Instructions      Influenza test is positive Continue to alternate Tylenol and ibuprofen for management of fever. Force fluids to maintain hydration. Tamiflu once daily for the next 5 days to reduce symptoms and course of influenza virus.  If you develop any shortness of breath, wheezing or difficulty breathing go immediately to the nearest emergency department.      ED Prescriptions     Medication Sig Dispense Auth. Provider   oseltamivir (TAMIFLU) 75 MG capsule Take 1 capsule (75 mg total) by mouth 2 (two) times daily. 10 capsule Scot Jun, FNP   promethazine-dextromethorphan (PROMETHAZINE-DM) 6.25-15 MG/5ML syrup Take 5 mLs by mouth 4 (four) times daily as needed for cough. 180 mL Scot Jun, FNP      PDMP not reviewed this encounter.   Scot Jun, FNP 04/05/21 1230

## 2021-04-08 ENCOUNTER — Telehealth: Payer: BC Managed Care – PPO | Admitting: Family Medicine

## 2021-04-08 ENCOUNTER — Other Ambulatory Visit: Payer: Self-pay | Admitting: Psychiatry

## 2021-04-08 DIAGNOSIS — F330 Major depressive disorder, recurrent, mild: Secondary | ICD-10-CM

## 2021-04-08 DIAGNOSIS — F4312 Post-traumatic stress disorder, chronic: Secondary | ICD-10-CM

## 2021-04-08 DIAGNOSIS — F902 Attention-deficit hyperactivity disorder, combined type: Secondary | ICD-10-CM

## 2021-04-08 DIAGNOSIS — F422 Mixed obsessional thoughts and acts: Secondary | ICD-10-CM

## 2021-04-08 NOTE — Telephone Encounter (Signed)
Voice mail is full  

## 2021-04-21 ENCOUNTER — Ambulatory Visit (INDEPENDENT_AMBULATORY_CARE_PROVIDER_SITE_OTHER): Payer: BC Managed Care – PPO | Admitting: Family Medicine

## 2021-04-21 ENCOUNTER — Other Ambulatory Visit: Payer: Self-pay

## 2021-04-21 ENCOUNTER — Encounter: Payer: Self-pay | Admitting: Family Medicine

## 2021-04-21 VITALS — BP 113/70 | HR 81 | Resp 16 | Ht 62.0 in | Wt 145.0 lb

## 2021-04-21 DIAGNOSIS — F5082 Avoidant/restrictive food intake disorder: Secondary | ICD-10-CM

## 2021-04-21 DIAGNOSIS — R4589 Other symptoms and signs involving emotional state: Secondary | ICD-10-CM | POA: Insufficient documentation

## 2021-04-21 DIAGNOSIS — E049 Nontoxic goiter, unspecified: Secondary | ICD-10-CM

## 2021-04-21 DIAGNOSIS — R6889 Other general symptoms and signs: Secondary | ICD-10-CM

## 2021-04-21 DIAGNOSIS — F902 Attention-deficit hyperactivity disorder, combined type: Secondary | ICD-10-CM

## 2021-04-21 DIAGNOSIS — G44229 Chronic tension-type headache, not intractable: Secondary | ICD-10-CM

## 2021-04-21 DIAGNOSIS — F40231 Fear of injections and transfusions: Secondary | ICD-10-CM

## 2021-04-21 DIAGNOSIS — R635 Abnormal weight gain: Secondary | ICD-10-CM

## 2021-04-21 DIAGNOSIS — F422 Mixed obsessional thoughts and acts: Secondary | ICD-10-CM

## 2021-04-21 DIAGNOSIS — F4312 Post-traumatic stress disorder, chronic: Secondary | ICD-10-CM

## 2021-04-21 DIAGNOSIS — Z9152 Personal history of nonsuicidal self-harm: Secondary | ICD-10-CM

## 2021-04-21 DIAGNOSIS — F339 Major depressive disorder, recurrent, unspecified: Secondary | ICD-10-CM

## 2021-04-21 MED ORDER — ARIPIPRAZOLE 5 MG PO TABS: 5.0000 mg | ORAL_TABLET | Freq: Every day | ORAL | 0 refills | Status: DC

## 2021-04-21 NOTE — Assessment & Plan Note (Signed)
Continue Abilify; working well for patient

## 2021-04-21 NOTE — Assessment & Plan Note (Signed)
Slight enlargement on L side on exam Non tender Referral for [place]

## 2021-04-21 NOTE — Assessment & Plan Note (Signed)
Chronic, worsening Started CBT with new therapist at [facility]  Currently off depression medication d/t complications with medication choice -advised to seek approval from psych/counselor and RTC for follow up care.

## 2021-04-21 NOTE — Assessment & Plan Note (Signed)
Unable to perform any lab work for thyroid testing; discussed option of EMLA cream or laying down for labs Also discussed use of PRN medication to assist with nerves prior to lab work Pt reported that the only way she'd be able to get blood drawn would be if she was 'knocked out' and even then she feels that if the needle was inside of her she'd wake up

## 2021-04-21 NOTE — Assessment & Plan Note (Signed)
Sees both psych and CBT counseling Worsened in the last year per pt report

## 2021-04-21 NOTE — Assessment & Plan Note (Signed)
40+ lb weight gain Other possibilities outside of thyroid include -severe depression -limited diet -lack of physical exercise  -slowing of metabolism (teen to 27s)

## 2021-04-21 NOTE — Assessment & Plan Note (Signed)
Bilateral pressure at temples No associated symptoms Nothing relieves pressure Referral to neurology

## 2021-04-21 NOTE — Progress Notes (Signed)
Established patient visit   Patient: Cynthia Castaneda   DOB: 1995/06/13   25 y.o. Female  MRN: [id number] Visit Date: 04/21/2021  Today's healthcare provider: Jacky Kindle, FNP   Chief Complaint  Patient presents with   Weight Gain    Patient comes in office today with concerns of weight gain in the past year. Patient states that she believes that she has gained between 50-60lbs in the past year. Patient reports that she eats 1-2 meals a day and is not actively exercising. Patient reports symptoms of fatigue but denies, change in appetite, or GI upset or intolerance to heat or cold.    Subjective    HPI HPI     Weight Gain    Additional comments: Patient comes in office today with concerns of weight gain in the past year. Patient states that she believes that she has gained between 50-60lbs in the past year. Patient reports that she eats 1-2 meals a day and is not actively exercising. Patient reports symptoms of fatigue but denies, change in appetite, or GI upset or intolerance to heat or cold.       Last edited by Fonda Kinder, CMA on 04/21/2021  8:16 AM.      Depression screen [facility] 2/9 04/21/2021 01/06/2020 11/03/2016  Decreased Interest 3 2 3   Down, Depressed, Hopeless 3 3 2   PHQ - 2 Score 6 5 5   Altered sleeping 3 3 3   Tired, decreased energy 3 2 3   Change in appetite 3 3 3   Feeling bad or failure about yourself  3 3 2   Trouble concentrating 3 3 2   Moving slowly or fidgety/restless 2 2 3   Suicidal thoughts 2 1 2   PHQ-9 Score 25 22 23   Difficult doing work/chores Extremely dIfficult Very difficult Somewhat difficult     Medications: Outpatient Medications Prior to Visit  Medication Sig   melatonin 5 MG TABS Take 1 tablet (5 mg total) by mouth at bedtime. (Patient taking differently: Take 5 mg by mouth at bedtime as needed.)   [DISCONTINUED] lamoTRIgine (LAMICTAL) 25 MG tablet Take 1 tablet (25 mg total) by mouth daily.   [DISCONTINUED] ARIPiprazole (ABILIFY)  5 MG tablet TAKE 1 TABLET BY MOUTH AT BEDTIME   [DISCONTINUED] LORazepam (ATIVAN) 0.5 MG tablet Take 1 tablet (0.5 mg total) by mouth 2 (two) times daily as needed for anxiety.   [DISCONTINUED] methylphenidate (RITALIN) 10 MG tablet Take 1 tablet (10 mg total) by mouth 2 (two) times daily.   [DISCONTINUED] methylphenidate (RITALIN) 10 MG tablet Take 1 tablet (10 mg total) by mouth 2 (two) times daily.   [DISCONTINUED] methylphenidate (RITALIN) 10 MG tablet Take 1 tablet (10 mg total) by mouth 2 (two) times daily.   [DISCONTINUED] oseltamivir (TAMIFLU) 75 MG capsule Take 1 capsule (75 mg total) by mouth 2 (two) times daily.   [DISCONTINUED] promethazine-dextromethorphan (PROMETHAZINE-DM) 6.25-15 MG/5ML syrup Take 5 mLs by mouth 4 (four) times daily as needed for cough.   No facility-administered medications prior to visit.    Review of Systems     Objective    BP 113/70    Pulse 81    Resp 16    Ht 5\' 2"  (1.575 m)    Wt 145 lb (65.8 kg)    SpO2 100%    BMI 26.52 kg/m    Physical Exam Vitals and nursing note reviewed.  Constitutional:      General: She is not in acute distress.  Appearance: Normal appearance. She is overweight. She is not ill-appearing, toxic-appearing or diaphoretic.  HENT:     Head: Normocephalic and atraumatic.  Neck:     Thyroid: Thyromegaly present. No thyroid tenderness.   Cardiovascular:     Rate and Rhythm: Normal rate and regular rhythm.     Pulses: Normal pulses.     Heart sounds: Normal heart sounds. No murmur heard.   No friction rub. No gallop.  Pulmonary:     Effort: Pulmonary effort is normal. No respiratory distress.     Breath sounds: Normal breath sounds. No stridor. No wheezing, rhonchi or rales.  Chest:     Chest wall: No tenderness.  Abdominal:     General: Bowel sounds are normal.     Palpations: Abdomen is soft.  Musculoskeletal:        General: No swelling, tenderness, deformity or signs of injury. Normal range of motion.      Cervical back: Full passive range of motion without pain, normal range of motion and neck supple. No edema, erythema, signs of trauma, rigidity, torticollis or crepitus. No pain with movement. Normal range of motion.     Right lower leg: No edema.     Left lower leg: No edema.  Skin:    General: Skin is warm and dry.     Capillary Refill: Capillary refill takes less than 2 seconds.     Coloration: Skin is not jaundiced or pale.     Findings: No bruising, erythema, lesion or rash.  Neurological:     General: No focal deficit present.     Mental Status: She is alert and oriented to person, place, and time. Mental status is at baseline.     Cranial Nerves: No cranial nerve deficit.     Sensory: No sensory deficit.     Motor: No weakness.     Coordination: Coordination normal.  Psychiatric:        Mood and Affect: Mood is anxious and depressed. Affect is flat and tearful.        Behavior: Behavior normal.        Thought Content: Thought content normal.        Judgment: Judgment normal.     No results found for any visits on 04/21/21.  Assessment & Plan     Problem List Items Addressed This Visit       Endocrine   Enlarged thyroid gland    Slight enlargement on L side on exam Non tender Referral for [place]      Relevant Orders   US THYROID     Nervous and Auditory   Chronic tension-type headache, not intractable    Bilateral pressure at temples No associated symptoms Nothing relieves pressure Referral to neurology       Relevant Orders   Ambulatory referral to Neurology     Other   Chronic post-traumatic stress disorder (PTSD)    Sees both psych and CBT counseling Worsened in the last year per pt report      Relevant Medications   ARIPiprazole (ABILIFY) 5 MG tablet   Attention deficit hyperactivity disorder (ADHD), combined type, moderate    Continue Abilify; working well for patient       Relevant Medications   ARIPiprazole (ABILIFY) 5 MG tablet   Obsessive  compulsive disorder   Relevant Medications   ARIPiprazole (ABILIFY) 5 MG tablet   Avoidant-restrictive food intake disorder (ARFID)    Self reported weight gain Eats 1-2 meals per day Request  testing for Autism d/t correlation between ARFID and ASDs      Temperature intolerance    Ongoing concern for 5+ years      Relevant Orders   US THYROID   Weight gain finding - Primary    40+ lb weight gain Other possibilities outside of thyroid include -severe depression -limited diet -lack of physical exercise  -slowing of metabolism (teen to 7220s)      Relevant Orders   US THYROID   Depression, recurrent (HCC)    Chronic, worsening Started CBT with new therapist at [facility]  Currently off depression medication d/t complications with medication choice -advised to seek approval from psych/counselor and RTC for follow up care.      Severe needle phobia    Unable to perform any lab work for thyroid testing; discussed option of EMLA cream or laying down for labs Also discussed use of PRN medication to assist with nerves prior to lab work Pt reported that the only way she'd be able to get blood drawn would be if she was 'knocked out' and even then she feels that if the needle was inside of her she'd wake up      Personal history of nonsuicidal self-injury    Current reports negative self talk for saying the wrong thing, or looking 'stupid'  Self harm behaviors include pinching  Previous behaviors include scratching, hitting, choking (no LOC) Has suicidal thoughts; no active plan Committed to safety- for the sake of her mother       Return in about 4 weeks (around 05/19/2021), or if symptoms worsen or fail to improve, for anxiety and depression.     Leilani MerlI, Tammela Bales T Sholanda Croson, FNP, have reviewed all documentation for this visit. The documentation on 04/21/21 for the exam, diagnosis, procedures, and orders are all accurate and complete.    Jacky KindleElise T Lamerle Jabs, FNP  [facility] [phone] (phone) [phone] (fax)  [facility]

## 2021-04-21 NOTE — Assessment & Plan Note (Signed)
Ongoing concern for 5+ years

## 2021-04-21 NOTE — Assessment & Plan Note (Signed)
Self reported weight gain Eats 1-2 meals per day Request testing for Autism d/t correlation between ARFID and ASDs

## 2021-04-21 NOTE — Assessment & Plan Note (Signed)
Current reports negative self talk for saying the wrong thing, or looking 'stupid'  Self harm behaviors include pinching  Previous behaviors include scratching, hitting, choking (no LOC) Has suicidal thoughts; no active plan Committed to safety- for the sake of her mother

## 2021-04-27 ENCOUNTER — Ambulatory Visit
Admission: RE | Admit: 2021-04-27 | Discharge: 2021-04-27 | Disposition: A | Discharge status: Home / Self Care | Payer: BC Managed Care – PPO | Source: Ambulatory Visit | Attending: Family Medicine | Admitting: Family Medicine

## 2021-04-27 ENCOUNTER — Other Ambulatory Visit: Payer: Self-pay

## 2021-04-27 DIAGNOSIS — E049 Nontoxic goiter, unspecified: Secondary | ICD-10-CM

## 2021-04-27 DIAGNOSIS — R635 Abnormal weight gain: Secondary | ICD-10-CM | POA: Diagnosis not present

## 2021-04-27 DIAGNOSIS — R6889 Other general symptoms and signs: Secondary | ICD-10-CM

## 2021-04-28 ENCOUNTER — Telehealth: Payer: Self-pay | Admitting: Family Medicine

## 2021-04-28 DIAGNOSIS — R635 Abnormal weight gain: Secondary | ICD-10-CM

## 2021-04-28 NOTE — Telephone Encounter (Signed)
Copied from CRM [id number]. Topic: General - Other >> Apr 28, 2021  8:15 AM Jaquita Rector A wrote: Reason for CRM: Patient mom Waynetta Sandy called in needing to know if Dr Sherrie Mustache would please have patient come in to have a urinalysis to check patient for diabetic issues. Patient had some concerns when she was seen on 04/21/21 but blood work was done but no urine check. Please call patient or mom Beth with instructions from Dr Sherrie Mustache  at  [facility] [phone]

## 2021-04-28 NOTE — Telephone Encounter (Signed)
I called and spoke with patient. Patient says she didn't have a concern about diabetes. She is concerned about her thyroid. She says when she had her thyroid examined in the office she was told that it felt enlarged. I advised her of thyroid US results. Patient stated "I don't feel that the ultrasound helped to find out anything. My thyroid still hurts so I know something is wrong". Patient is requesting iodine level testing through a urine test. She is concerned about Hashimoto's disease and would like this test ordered. Please advise.

## 2021-04-28 NOTE — Telephone Encounter (Signed)
She can have referral to endocrinology for abnormal weight gain. I don't know anything about urine tests for thyroid disease. That's out of my scope of practice.

## 2021-04-28 NOTE — Telephone Encounter (Signed)
Please advise. This patient was recently seen by Robynn Pane. Per message below labs were ordered. I don't see any labs ordered during ov on 04/21/2021. Mom and patient have concerns about possible diabetes. Des patient need to come back in, or can labs be ordered?

## 2021-04-28 NOTE — Telephone Encounter (Signed)
Recommend TSH, T4, CBC and met C.

## 2021-04-28 NOTE — Telephone Encounter (Signed)
Patient advised of Dr. Theodis Aguas recommendation for labs. She states "I already told them that Im not able to do blood work. I know the test can be done through urine and I would prefer to have that done". I advised patient that a urine sample is not an ideal way to test for thyroid abnormalities. Patient restated "I already told them that Im not able to do blood work. I would prefer to have the urine test done". Patient refuses to have labs. She states "Im just going to go somewhere else to have the testing done because I know if can be done".

## 2021-04-28 NOTE — Telephone Encounter (Signed)
Patient advised and agrees to referral. Referral order placed. Please schedule appt.

## 2021-04-29 ENCOUNTER — Telehealth: Payer: Self-pay

## 2021-04-29 NOTE — Telephone Encounter (Signed)
Copied from CRM [id number]. Topic: General - Other >> Apr 29, 2021  1:39 PM Randol Kern wrote: Reason for CRM: Boyd Kerbs from [facility] supply says she needs labs faxed to her  Best contact: [phone] extension [phone] Fax: [phone] attention Omega    Needs current labs faxed over

## 2021-04-30 NOTE — Telephone Encounter (Signed)
Cynthia Castaneda we do not have recent lab work on Cynthia Castaneda.   Thanks,   -Mickel Baas

## 2021-04-30 NOTE — Telephone Encounter (Signed)
Omega with [facility] called in asking for patients labs to be faxed over today so they can schedule patient to be seen Please advise and fax to Fax: [phone] attention Omega

## 2021-06-30 ENCOUNTER — Telehealth (INDEPENDENT_AMBULATORY_CARE_PROVIDER_SITE_OTHER): Payer: 59 | Admitting: Psychiatry

## 2021-06-30 ENCOUNTER — Encounter: Payer: Self-pay | Admitting: Psychiatry

## 2021-06-30 DIAGNOSIS — F330 Major depressive disorder, recurrent, mild: Secondary | ICD-10-CM | POA: Diagnosis not present

## 2021-06-30 DIAGNOSIS — F902 Attention-deficit hyperactivity disorder, combined type: Secondary | ICD-10-CM

## 2021-06-30 DIAGNOSIS — F422 Mixed obsessional thoughts and acts: Secondary | ICD-10-CM

## 2021-06-30 DIAGNOSIS — F4312 Post-traumatic stress disorder, chronic: Secondary | ICD-10-CM

## 2021-06-30 MED ORDER — ARIPIPRAZOLE 10 MG PO TABS: 5.0000 mg | ORAL_TABLET | Freq: Every day | ORAL | 1 refills | Status: DC

## 2021-06-30 MED ORDER — METHYLPHENIDATE HCL 10 MG PO TABS: 10.0000 mg | ORAL_TABLET | Freq: Two times a day (BID) | ORAL | 0 refills | Status: DC

## 2021-06-30 NOTE — Progress Notes (Signed)
Cynthia Castaneda ?[id number] ?25-Jul-1995 ?26 y.o. ? ?Virtual Visit via Video Note ? ?I connected with pt @ on 06/30/21 at  1:15 PM EDT by a video enabled telemedicine application and verified that I am speaking with the correct person using two identifiers. ?  ?I discussed the limitations of evaluation and management by telemedicine and the availability of in person appointments. The patient expressed understanding and agreed to proceed. ? ?I discussed the assessment and treatment plan with the patient. The patient was provided an opportunity to ask questions and all were answered. The patient agreed with the plan and demonstrated an understanding of the instructions. ?  ?The patient was advised to call back or seek an in-person evaluation if the symptoms worsen or if the condition fails to improve as anticipated. ? ?I provided 25 minutes of non-face-to-face time during this encounter.  The patient was located at home.  The provider was located at [place]. ? ? ?Thayer Headings, PMHNP  ? ?Subjective:  ? ?Patient ID:  Cynthia Castaneda is a 26 y.o. (DOB 25-Aug-1995) female. ? ?Chief Complaint:  ?Chief Complaint  ?Patient presents with  ? Anxiety  ? Depression  ? Sleeping Problem  ? ? ?HPI ?Cynthia Castaneda presents for follow-up of anxiety, depression, and insomnia. She reports that Abilify 5 mg seemed to help "for awhile and now don't think it is helping as much as it could be." She reports that intrusive thoughts continue and are worse when she does not take Abilify. She reports that intrusive thoughts are intermittent and worse at night. She reports that her anxiety has been better "but still not good." She reports that she has intrusive thoughts about something happening in her sleep and then is afraid to sleep. She ends up sleeping 10 hours and reports that this cuts into her day. She reports that panic attacks are infrequent and usually work related. She reports frequent dissociation.  ? ?She reports  that her mood has been "pretty bad." She reports severe depression some nights when she has not been able to sleep. She reports difficulty with concentration. Energy and motivation have been very low. Reports that she has not been motivated to clean her home and maintain hygiene. She reports that she has been using energy drinks to help while she is at work. She reports that she has been "very hungry, all the time." Has been attempting to lose weight. She reports chronic suicidal thoughts without intent. She contracts for safety and reports that she lets her mother and boyfriend know if she is having suicidal thoughts.   ? ?She is working as a Retail banker events.  ? ?She is currently not seeing a therapist. She tried [facility]" online therapy.  ? ?Has her last GED test this month and thinks that Ritalin would be helpful with preparing for this.  ? ?Past Psychiatric Medication Trials: ?Ritalin-Effective ?Concerta ?Celexa ?BuSpar ?Prozac-SI ?Zoloft-SI ?Lexapro ?Pristiq-Helped ?Cymbalta-Helped. Does not recall side effects ?Trintellix- "Felt drunk" and had n/v ?Pamelor ?Neurontin- Helpful for a year and then caused nausea ?Abilify- Unable to tolerate doses higher than 2.5 mg. Had dizziness and felt as if she could pass out with 5 mg.  ?Lamictal- May have helped for awhile and then no longer as effective ?Lorazepam- More effective than Klonopin ?Klonopin ? ?Review of Systems:  ?Review of Systems  ?Musculoskeletal:  Negative for gait problem.  ?Neurological:  Negative for dizziness and tremors.  ?     She reports some RLS  and tics. Denies any other involuntary movements.   ?Psychiatric/Behavioral:    ?     Please refer to HPI  ? ?Medications: I have reviewed the patient's current medications. ? ?Current Outpatient Medications  ?Medication Sig Dispense Refill  ? ARIPiprazole (ABILIFY) 10 MG tablet Take 1 tablet (10 mg total) by mouth at bedtime. 30 tablet 1  ? melatonin 5 MG TABS Take 1 tablet (5 mg total) by  mouth at bedtime. (Patient taking differently: Take 5 mg by mouth at bedtime as needed.) 30 tablet 1  ? methylphenidate (RITALIN) 10 MG tablet Take 1 tablet (10 mg total) by mouth 2 (two) times daily. 60 tablet 0  ? ?No current facility-administered medications for this visit.  ? ? ?Medication Side Effects: None ? ?Allergies:  ?Allergies  ?Allergen Reactions  ? Amoxicillin Other (See Comments)  ?  Unknown childhood allergy ?UNSURE  ? ? ?Past Medical History:  ?Diagnosis Date  ? ADHD (attention deficit hyperactivity disorder)   ? Allergy   ? Anxiety   ? Borderline personality disorder ([place])   ? Depression   ? Eating disorder   ? Frequent headaches   ? Migraine   ? OCD (obsessive compulsive disorder)   ? PTSD (post-traumatic stress disorder)   ? ? ?Family History  ?Problem Relation Age of Onset  ? Depression Mother   ? Mental illness Mother   ? OCD Mother   ? Memory loss Mother   ? Depression Father   ? Mental illness Father   ? Depression Brother   ? Mental illness Brother   ? OCD Maternal Grandmother   ? OCD Maternal Aunt   ? Bladder Cancer Neg Hx   ? Kidney cancer Neg Hx   ? ? ?Social History  ? ?Socioeconomic History  ? Marital status: Single  ?  Spouse name: Not on file  ? Number of children: Not on file  ? Years of education: Not on file  ? Highest education level: Not on file  ?Occupational History  ? Not on file  ?Tobacco Use  ? Smoking status: Never  ? Smokeless tobacco: Never  ?Vaping Use  ? Vaping Use: Never used  ?Substance and Sexual Activity  ? Alcohol use: Yes  ? Drug use: No  ? Sexual activity: Not on file  ?Other Topics Concern  ? Not on file  ?Social History Narrative  ? Not on file  ? ?Social Determinants of Health  ? ?Financial Resource Strain: Not on file  ?Food Insecurity: Not on file  ?Transportation Needs: Not on file  ?Physical Activity: Not on file  ?Stress: Not on file  ?Social Connections: Not on file  ?Intimate Partner Violence: Not on file  ? ? ?Past Medical History, Surgical history,  Social history, and Family history were reviewed and updated as appropriate.  ? ?Please see review of systems for further details on the patient's review from today.  ? ?Objective:  ? ?Physical Exam:  ?There were no vitals taken for this visit. ? ?Physical Exam ?Neurological:  ?   Mental Status: She is alert and oriented to person, place, and time.  ?   Cranial Nerves: No dysarthria.  ?Psychiatric:     ?   Attention and Perception: Attention and perception normal.     ?   Mood and Affect: Mood is anxious and depressed.     ?   Speech: Speech normal.     ?   Behavior: Behavior is cooperative.     ?  Thought Content: Thought content normal. Thought content is not paranoid or delusional. Thought content does not include homicidal or suicidal ideation. Thought content does not include homicidal or suicidal plan.     ?   Cognition and Memory: Cognition and memory normal.     ?   Judgment: Judgment normal.  ?   Comments: Insight intact  ? ? ?Lab Review:  ?No results found for: NA, K, CL, CO2, GLUCOSE, BUN, CREATININE, CALCIUM, PROT, ALBUMIN, AST, ALT, ALKPHOS, BILITOT, GFRNONAA, GFRAA ? ?No results found for: WBC, RBC, HGB, HCT, PLT, MCV, MCH, MCHC, RDW, LYMPHSABS, MONOABS, EOSABS, BASOSABS ? ?No results found for: POCLITH, LITHIUM  ? ?No results found for: PHENYTOIN, PHENOBARB, VALPROATE, CBMZ  ? ?.res ?Assessment: Plan:   ?Pt seen for 25 minutes and time spent discussing recent anxiety and depressive s/s. Discussed potential benefits, risks, and side effects of increasing Abilify to 10 mg po qd. Pt agrees to dose increase. ?Also discussed that it may be helpful to re-start therapy. Referral made to [facility], [facility].  ?Will re-start Ritalin 10 mg po BID for ADHD while preparing for GED testing.  ?Pt to follow-up in 4-6 weeks or sooner if clinically indicated.  ?Patient advised to contact office with any questions, adverse effects, or acute worsening in signs and symptoms. ? ?Jadalise was seen today for anxiety,  depression and sleeping problem. ? ?Diagnoses and all orders for this visit: ? ?Chronic post-traumatic stress disorder (PTSD) ?-     Discontinue: ARIPiprazole (ABILIFY) 10 MG tablet; Take 0.5 tablets (5 mg total)

## 2021-07-01 ENCOUNTER — Telehealth: Payer: Self-pay

## 2021-07-01 MED ORDER — ARIPIPRAZOLE 10 MG PO TABS: 10.0000 mg | ORAL_TABLET | Freq: Every day | ORAL | 1 refills | Status: DC

## 2021-07-01 NOTE — Telephone Encounter (Signed)
PA initiated and approved  ?CAREMARK ?Methylphenidate 10 mg tab ?Effective 06/30/21 - 06/29/24 ? ?

## 2021-07-31 ENCOUNTER — Other Ambulatory Visit: Payer: Self-pay | Admitting: Psychiatry

## 2021-07-31 DIAGNOSIS — F422 Mixed obsessional thoughts and acts: Secondary | ICD-10-CM

## 2021-07-31 DIAGNOSIS — F902 Attention-deficit hyperactivity disorder, combined type: Secondary | ICD-10-CM

## 2021-07-31 DIAGNOSIS — F330 Major depressive disorder, recurrent, mild: Secondary | ICD-10-CM

## 2021-07-31 DIAGNOSIS — F4312 Post-traumatic stress disorder, chronic: Secondary | ICD-10-CM

## 2021-08-02 NOTE — Telephone Encounter (Signed)
This is discontinued. Pt dose increased to 10 mg already submitted  ?

## 2021-08-02 NOTE — Telephone Encounter (Signed)
Discontinued by another provider. Tried to call patient to see if she was still taking. Mailbox is full.  ?

## 2021-09-20 ENCOUNTER — Ambulatory Visit: Payer: 59 | Admitting: Family Medicine

## 2021-09-20 ENCOUNTER — Ambulatory Visit: Payer: Self-pay | Admitting: *Deleted

## 2021-09-20 ENCOUNTER — Encounter: Payer: Self-pay | Admitting: Family Medicine

## 2021-09-20 VITALS — BP 112/73 | HR 98 | Temp 98.6°F | Resp 16 | Wt 146.0 lb

## 2021-09-20 DIAGNOSIS — J029 Acute pharyngitis, unspecified: Secondary | ICD-10-CM

## 2021-09-20 LAB — POCT RAPID STREP A (OFFICE): Rapid Strep A Screen: NEGATIVE

## 2021-09-20 MED ORDER — AZITHROMYCIN 250 MG PO TABS: 250.0000 mg | ORAL_TABLET | Freq: Every day | ORAL | 0 refills | Status: DC

## 2021-09-20 NOTE — Progress Notes (Signed)
I,Roshena L Chambers,acting as a scribe for Lelon Huh, MD.,have documented all relevant documentation on the behalf of Lelon Huh, MD,as directed by  Lelon Huh, MD while in the presence of Lelon Huh, MD.   Established patient visit   Patient: Cynthia Castaneda   DOB: 19-Sep-1995   25 y.o. Female  MRN: [id number] Visit Date: 09/20/2021  Today's healthcare provider: Lelon Huh, MD   Chief Complaint  Patient presents with   Sore Throat   Subjective    Sore Throat  Associated symptoms include diarrhea and headaches. Pertinent negatives include no abdominal pain, shortness of breath or vomiting.   Patient was seen at [facility] on 08/05/2021 for sore throat. She tested positive for strep and was treated with Cefdinir. Patient stopped taking antibiotics because she developed a rash. She returned to urgent care on 09/15/2021 for sore throat. She tested positive for strep infection and was treated with azithromycin (Zithromax) 500 MG tablet; Take 1 tablet (500 mg total) by mouth 1 (one) time each day for 5 days and predniSONE (Deltasone) 20 MG tablet; Take 2 tablets (40 mg total) by mouth 1 (one) time each day for 5 days. Patient reports completing all does of prednisone and azithromycin. She states the prednisone made her nauseous but she did take all of it. She reports her throat hurts as much today as it did before she started antibiotics.   Medications: Outpatient Medications Prior to Visit  Medication Sig   ARIPiprazole (ABILIFY) 10 MG tablet Take 1 tablet (10 mg total) by mouth at bedtime.   methylphenidate (RITALIN) 10 MG tablet Take 1 tablet (10 mg total) by mouth 2 (two) times daily.   [DISCONTINUED] melatonin 5 MG TABS Take 1 tablet (5 mg total) by mouth at bedtime. (Patient not taking: Reported on 09/20/2021)   No facility-administered medications prior to visit.    Review of Systems  Constitutional:  Negative for appetite change, chills, fatigue and  fever.  Respiratory:  Negative for chest tightness and shortness of breath.   Cardiovascular:  Negative for chest pain and palpitations.  Gastrointestinal:  Positive for diarrhea and nausea. Negative for abdominal pain and vomiting.  Neurological:  Positive for headaches. Negative for dizziness and weakness.      Objective    BP 112/73 (BP Location: Left Arm, Patient Position: Sitting, Cuff Size: Large)   Pulse 98   Temp 98.6 F (37 C) (Oral)   Resp 16   Wt 146 lb (66.2 kg)   LMP  (Within Weeks)   SpO2 97% Comment: room air  BMI 26.70 kg/m    Physical Exam  General Appearance:     Well developed, well nourished female, alert, cooperative, in no acute distress  HENT:   neck has bilateral anterior cervical nodes enlarged, tonsils red, enlarged, no exudate present, and sinuses nontender  Eyes:    PERRL, conjunctiva/corneas clear, EOM's intact       Lungs:     Clear to auscultation bilaterally, respirations unlabored  Heart:    Normal heart rate. Normal rhythm. No murmurs, rubs, or gallops.    Neurologic:   Awake, alert, oriented x 3. No apparent focal neurological           defect.         Results for orders placed or performed in visit on 09/20/21  POCT rapid strep A  Result Value Ref Range   Rapid Strep A Screen Negative Negative    Assessment &  Plan     1. Sore throat Treated for positive strep last week which is negative today. Still with enlarged tonsils and painful throat. Will extend antibiotic with additional. - azithromycin (ZITHROMAX) 250 MG tablet; Take 1 tablet (250 mg total) by mouth daily.  Dispense: 6 tablet; Refill: 0      The entirety of the information documented in the History of Present Illness, Review of Systems and Physical Exam were personally obtained by me. Portions of this information were initially documented by the CMA and reviewed by me for thoroughness and accuracy.     Lelon Huh, MD  [facility] [phone]  (phone) [phone] (fax)  [place]

## 2021-09-20 NOTE — Telephone Encounter (Signed)
Message from [place] sent at 09/20/2021 10:03 AM EDT  Summary: positive for strep throat,lightheaded, dizzy   Pt mother stated pt tested positive for strep throat and started with a rash possibly due to medication. I stopped medication, did not complete it, and went to see someone again about a week ago.   Pt mother mentioned pt has been feeling lightheaded, dizzy after taking medication, and tonsils are swollen. Pt was not with her, so her mother couldn't clarify a lot of information.    Please call pt directly.   [phone]           Call History   Type Contact Phone/Fax User  09/20/2021 09:57 AM EDT Phone (Incoming) Fuhs,Beth (Mother)     Reason for Disposition  Taking a medicine that could cause dizziness (e.g., blood pressure medications, diuretics)  Answer Assessment - Initial Assessment Questions 1. DESCRIPTION: "Describe your dizziness."     Positive for Strep.   Started after arthromycin and prednisone 5 days ago.  I'm feeling more dizzy today.   Not having to hold onto things to walk around.    Positive a few weeks and then 6 days ago tested positive still.  Still have a sore throat and my tonsils are really swollen and my lymph nosed.  No fever.    1st time I had an allergic reaction to the medication so I could not take it. 2. LIGHTHEADED: "Do you feel lightheaded?" (e.g., somewhat faint, woozy, weak upon standing)     Still light headed    3. VERTIGO: "Do you feel like either you or the room is spinning or tilting?" (i.e. vertigo)     Not asked 4. SEVERITY: "How bad is it?"  "Do you feel like you are going to faint?" "Can you stand and walk?"   - MILD: Feels slightly dizzy, but walking normally.   - MODERATE: Feels unsteady when walking, but not falling; interferes with normal activities (e.g., school, work).   - SEVERE: Unable to walk without falling, or requires assistance to walk without falling; feels like passing out now.      The dizziness starts  right after the prednisone and erythromycin and lasts all day.     5. ONSET:  "When did the dizziness begin?"     5-6 days ago with prednisone and erthromycin.   6. AGGRAVATING FACTORS: "Does anything make it worse?" (e.g., standing, change in head position)     *No Answer* 7. HEART RATE: "Can you tell me your heart rate?" "How many beats in 15 seconds?"  (Note: not all patients can do this)       *No Answer* 8. CAUSE: "What do you think is causing the dizziness?"     Medications  9. RECURRENT SYMPTOM: "Have you had dizziness before?" If Yes, ask: "When was the last time?" "What happened that time?"     *No Answer* 10. OTHER SYMPTOMS: "Do you have any other symptoms?" (e.g., fever, chest pain, vomiting, diarrhea, bleeding)       Coughing started yesterday.    I had diarrhea from the medication but it has stopped now. 11. PREGNANCY: "Is there any chance you are pregnant?" "When was your last menstrual period?"       Not asked  Protocols used: Dizziness - Lightheadedness-A-AH

## 2021-09-20 NOTE — Telephone Encounter (Signed)
  Chief Complaint: Lightheaded and dizzy from prednisone and erythromycin, rash from antibiotic for Strep throat Symptoms: above plus positive Strep throat with swollen tonsils and lymph nodes, cough that started yesterday. Frequency: Dizziness started with the Prednisone and Erythromycin which she has stopped and the dizziness is much better today Pertinent Negatives: Patient denies fever, diarrhea, runny nose. Or puss on tonsils. Disposition: [] ED /[] Urgent Care (no appt availability in office) / [x] Appointment(In office/virtual)/ []  [facility]/ [] Home Care/ [] Refused Recommended Disposition /[] [place] [facility]/ []  Follow-up with PCP Additional Notes: Appt made for today with Dr. Caryn Section for 2:20.  Mother, Chandice Tawney called in.   I called and talked with [name] directly for the triage information.

## 2021-11-07 ENCOUNTER — Other Ambulatory Visit: Payer: Self-pay | Admitting: Psychiatry

## 2021-11-07 DIAGNOSIS — F4312 Post-traumatic stress disorder, chronic: Secondary | ICD-10-CM

## 2021-11-07 DIAGNOSIS — F422 Mixed obsessional thoughts and acts: Secondary | ICD-10-CM

## 2021-11-07 DIAGNOSIS — F330 Major depressive disorder, recurrent, mild: Secondary | ICD-10-CM

## 2021-11-07 DIAGNOSIS — F902 Attention-deficit hyperactivity disorder, combined type: Secondary | ICD-10-CM

## 2021-11-09 ENCOUNTER — Other Ambulatory Visit: Payer: Self-pay

## 2021-11-09 ENCOUNTER — Telehealth: Payer: Self-pay | Admitting: Psychiatry

## 2021-11-09 DIAGNOSIS — F422 Mixed obsessional thoughts and acts: Secondary | ICD-10-CM

## 2021-11-09 DIAGNOSIS — F4312 Post-traumatic stress disorder, chronic: Secondary | ICD-10-CM

## 2021-11-09 DIAGNOSIS — F330 Major depressive disorder, recurrent, mild: Secondary | ICD-10-CM

## 2021-11-09 DIAGNOSIS — F902 Attention-deficit hyperactivity disorder, combined type: Secondary | ICD-10-CM

## 2021-11-09 MED ORDER — ARIPIPRAZOLE 10 MG PO TABS: 10.0000 mg | ORAL_TABLET | Freq: Every day | ORAL | 0 refills | Status: DC

## 2021-11-09 MED ORDER — METHYLPHENIDATE HCL 10 MG PO TABS: 10.0000 mg | ORAL_TABLET | Freq: Two times a day (BID) | ORAL | 0 refills | Status: DC

## 2021-11-09 NOTE — Telephone Encounter (Signed)
pended

## 2021-11-09 NOTE — Telephone Encounter (Signed)
Pt requesting RF generic Abilify and generic Ritalin @ [facility]. Apt 8/3.

## 2021-11-11 ENCOUNTER — Telehealth: Payer: Self-pay | Admitting: Psychiatry

## 2021-11-11 DIAGNOSIS — F902 Attention-deficit hyperactivity disorder, combined type: Secondary | ICD-10-CM

## 2021-11-11 DIAGNOSIS — F422 Mixed obsessional thoughts and acts: Secondary | ICD-10-CM

## 2021-11-11 DIAGNOSIS — F4312 Post-traumatic stress disorder, chronic: Secondary | ICD-10-CM

## 2021-11-11 MED ORDER — ARIPIPRAZOLE 10 MG PO TABS: 10.0000 mg | ORAL_TABLET | Freq: Every day | ORAL | 0 refills | Status: DC

## 2021-11-11 NOTE — Telephone Encounter (Signed)
LVM to [facility]. A 90-day supply had been sent in for Abilify. Has a RF for Ritalin for a 30-day supply available. A 90-day supply is not required for the Ritalin per pharmacy.

## 2021-11-11 NOTE — Telephone Encounter (Signed)
Next appt is 11/18/21. Cynthia Castaneda, mom called requesting 90 days on both the Ritalin and Abilify per insurance company. She states ins co is requiring it. Mom is listed on Marisue Ivan DPR to speak to. On 7/25 Ritalin was called in for 60 days and Abilify was 30 days.  Pharmacy is:  [id number] [place], [place] - [id number]   Phone:  [phone]  Fax:  [phone]

## 2021-11-17 ENCOUNTER — Ambulatory Visit: Payer: 59 | Admitting: Psychiatry

## 2021-11-18 ENCOUNTER — Encounter: Payer: Self-pay | Admitting: Psychiatry

## 2021-11-18 ENCOUNTER — Ambulatory Visit: Payer: 59 | Admitting: Psychiatry

## 2021-11-18 DIAGNOSIS — F4312 Post-traumatic stress disorder, chronic: Secondary | ICD-10-CM | POA: Diagnosis not present

## 2021-11-18 DIAGNOSIS — F902 Attention-deficit hyperactivity disorder, combined type: Secondary | ICD-10-CM

## 2021-11-18 DIAGNOSIS — F330 Major depressive disorder, recurrent, mild: Secondary | ICD-10-CM

## 2021-11-18 DIAGNOSIS — F422 Mixed obsessional thoughts and acts: Secondary | ICD-10-CM | POA: Diagnosis not present

## 2021-11-18 MED ORDER — ARIPIPRAZOLE 10 MG PO TABS: 10.0000 mg | ORAL_TABLET | Freq: Every day | ORAL | 0 refills | Status: AC

## 2021-11-18 MED ORDER — METHYLPHENIDATE HCL 10 MG PO TABS: 10.0000 mg | ORAL_TABLET | Freq: Two times a day (BID) | ORAL | 0 refills | Status: AC

## 2021-11-18 NOTE — Progress Notes (Signed)
Cynthia Castaneda [id number] 1995/09/28 25 y.o.  Subjective:   Patient ID:  Cynthia Castaneda is a 26 y.o. (DOB Aug 21, 1995) female.  Chief Complaint:  Chief Complaint  Patient presents with   Anxiety   Depression   ADHD    HPI Cynthia Castaneda presents to the office today for follow-up of anxiety, depression, and ADHD. She is accompanied by her mother. Pt reports that she has been trying to find a therapist. She tried a therapist through [facility] and did not like it. Has seen a therapist a few times through [facility]/insurance. This therapist referred her to another therapist that specialized in OCD and this did not work out. She is still trying to find a therapist.   Reports that she was "very depressed for awhile" and did not have access to her usual creative outlets and coping skills. Reports that she has been reprocessing past traumatic events. Reports a more recent traumatic event that she has not been able to process yet. She reports frequent intrusive memories. Denies flashbacks "but I do get the fear that I had when I was there... and the delusions that it is real and happening again." She reports dissociative symptoms at times. She reports that she was having nightmares and switched Abilify to morning and has not noticed dreams since then. Exaggerated startle response and hyper-vigilance. She reports that she has frequent worry. Has HA, nausea, acid reflux with anxiety. She reports panic attacks about every 2-3 days.   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 She reports that depression has been less. She reports that she was off medication for about a week and noticed increased irritability then. She reports some mild irritability at baseline. Motivation is good towards her jobs and is low towards taking herself. She reports that she has motivation for dieting- "I hate my body." Reports that she was restricting food intake to  1200 calories and is not eating 1600 calories. She notices Ritalin helps with her mood, motivation, and energy. She reports that she has been trying to take it more consistently recently since it seems to be helpful for her. She reports that she is better to rationalize anxious thoughts when taking Ritalin. Concentration is improved with Ritalin.   She reports that she has obsessive thoughts about suicide. Will picture suicide "even when not depressed." She describes intrusive thoughts about suicide. Denies any recent suicidal thoughts with intent. Denies any recent self-harm other than a brief scratch.   She reports that she did not increase Abilify to 10 mg po qd due to fears of adverse effects. She reports that she is now ok with increasing it.   She and her mother report long-standing tics when she has anxiety. Has had sensory issues since birth. Has ARFID. Has had long-standing difficulty making eye contact. She is on waitlist for autism spectrum screening.   Ritalin last filled 11/09/21  Working as a Retail banker DJ.   Past Psychiatric Medication Trials: Ritalin-Effective Concerta Celexa BuSpar Prozac-SI Zoloft-SI Lexapro Pristiq-Helped Cymbalta-Helped. Does not recall side effects Trintellix- "Felt drunk" and had n/v Pamelor Neurontin- Helpful for a year and then caused nausea Abilify Lamictal- May have helped for awhile and then no longer as effective Lorazepam- More effective than Klonopin Klonopin  AIMS    Flowsheet Row Office Visit from 11/18/2021 in [facility] Office Visit from 07/24/2020 in [facility]  AIMS Total Score 0 0  PHQ2-9    Flowsheet Row Office Visit from 09/20/2021 in [facility] Office Visit from 04/21/2021 in [facility] Office Visit from 01/06/2020 in [facility] Office Visit from 11/03/2016 in [facility]  PHQ-2 Total Score 0 6 5 5   PHQ-9 Total Score 1 25 22 23        Flowsheet Row ED from 04/05/2021 in [facility]  ED from 07/13/2019 in [facility]  C-SSRS RISK CATEGORY No Risk High Risk        Review of Systems:  Review of Systems  Gastrointestinal:        Acid reflux  Musculoskeletal:  Negative for gait problem.  Neurological:  Negative for tremors.  Psychiatric/Behavioral:         Please refer to HPI    Medications: I have reviewed the patient's current medications.  Current Outpatient Medications  Medication Sig Dispense Refill   [START ON 01/04/2022] methylphenidate (RITALIN) 10 MG tablet Take 1 tablet (10 mg total) by mouth 2 (two) times daily. 60 tablet 0   ARIPiprazole (ABILIFY) 10 MG tablet Take 1 tablet (10 mg total) by mouth at bedtime. 90 tablet 0   [START ON 12/07/2021] methylphenidate (RITALIN) 10 MG tablet Take 1 tablet (10 mg total) by mouth 2 (two) times daily. 60 tablet 0   No current facility-administered medications for this visit.    Medication Side Effects: None  Allergies:  Allergies  Allergen Reactions   Amoxicillin Other (See Comments)    Unknown childhood allergy UNSURE   Cefdinir Rash    Past Medical History:  Diagnosis Date   ADHD (attention deficit hyperactivity disorder)    Allergy    Anxiety    Borderline personality disorder (HCC)    Depression    Eating disorder    Frequent headaches    Migraine    OCD (obsessive compulsive disorder)    PTSD (post-traumatic stress disorder)     Past Medical History, Surgical history, Social history, and Family history were reviewed and updated as appropriate.   Please see review of systems for further details on the patient's review from today.   Objective:   Physical Exam:  BP 121/69   Pulse 81   Physical Exam Constitutional:      General: She is not in acute distress. Musculoskeletal:        General: No deformity.  Neurological:     Mental Status: She is alert and oriented to  person, place, and time.     Coordination: Coordination normal.  Psychiatric:        Attention and Perception: Attention and perception normal. She does not perceive auditory or visual hallucinations.        Mood and Affect: Mood is anxious and depressed. Affect is not labile, blunt, angry or inappropriate.        Speech: Speech normal.        Behavior: Behavior normal.        Thought Content: Thought content normal. Thought content is not paranoid or delusional. Thought content does not include homicidal or suicidal ideation. Thought content does not include homicidal or suicidal plan.        Cognition and Memory: Cognition and memory normal.        Judgment: Judgment normal.     Comments: Insight intact     Lab Review:  No results found for: "NA", "K", "CL", "CO2", "GLUCOSE", "BUN", "CREATININE", "CALCIUM", "PROT", "ALBUMIN", "AST", "ALT", "ALKPHOS", "BILITOT", "  GFRNONAA", "GFRAA"  No results found for: "WBC", "RBC", "HGB", "HCT", "PLT", "MCV", "MCH", "MCHC", "RDW", "LYMPHSABS", "MONOABS", "EOSABS", "BASOSABS"  No results found for: "POCLITH", "LITHIUM"   No results found for: "PHENYTOIN", "PHENOBARB", "VALPROATE", "CBMZ"   .res Assessment: Plan:     Patient seen for over 30 minutes and time spent discussing recent difficulties with finding a therapist and making a referral to Lina Sayre, [facility] for therapy.  Patient reports that she had some fears about increasing Abilify to 10 mg daily and now feels ready to increase the dose to 10 mg since Abilify has been significantly more helpful for her mood and anxiety symptoms compared to any other medications that she has tried. She and her mother have also noticed that her mood and anxiety symptoms are better controlled after taking Ritalin and patient's mother reports that she is able to recognize whether patient has taken her medication.  Patient reports that Ritalin is also helpful for her concentration and has decided to start taking  Ritalin more consistently.  The patient to follow-up with this provider in 2 months or sooner if clinically indicated. Patient advised to contact office with any questions, adverse effects, or acute worsening in signs and symptoms.    Kathlynn was seen today for anxiety, depression and adhd.  Diagnoses and all orders for this visit:  Chronic post-traumatic stress disorder (PTSD) -     ARIPiprazole (ABILIFY) 10 MG tablet; Take 1 tablet (10 mg total) by mouth at bedtime.  Mixed obsessional thoughts and acts -     ARIPiprazole (ABILIFY) 10 MG tablet; Take 1 tablet (10 mg total) by mouth at bedtime.  Attention deficit hyperactivity disorder (ADHD), combined type, moderate -     ARIPiprazole (ABILIFY) 10 MG tablet; Take 1 tablet (10 mg total) by mouth at bedtime. -     methylphenidate (RITALIN) 10 MG tablet; Take 1 tablet (10 mg total) by mouth 2 (two) times daily. -     methylphenidate (RITALIN) 10 MG tablet; Take 1 tablet (10 mg total) by mouth 2 (two) times daily.  Mild recurrent major depression ([place])     Please see After Visit Summary for patient specific instructions.  Future Appointments  Date Time Provider [place]  01/25/2022  1:00 PM Lina Sayre, [facility] CP-CP None  01/25/2022  2:00 PM Thayer Headings, PMHNP CP-CP None    No orders of the defined types were placed in this encounter.   -------------------------------

## 2021-11-18 NOTE — Patient Instructions (Addendum)
Possible Therapy Referrals:  Gretchen Short, LCSW Specializes in OCD. [email or web address] [phone]  Stevphen Meuse, [facility] Therapist in our office that does EMDR  Monique Crutchfield, LCSW [email or web address] She does EMDR [phone]

## 2021-12-19 ENCOUNTER — Telehealth: Payer: 59 | Admitting: Physician Assistant

## 2021-12-19 DIAGNOSIS — U071 COVID-19: Secondary | ICD-10-CM | POA: Diagnosis not present

## 2021-12-19 MED ORDER — BENZONATATE 100 MG PO CAPS: 100.0000 mg | ORAL_CAPSULE | Freq: Three times a day (TID) | ORAL | 0 refills | Status: AC | PRN

## 2021-12-19 NOTE — Progress Notes (Signed)

## 2021-12-19 NOTE — Progress Notes (Signed)
I have spent 5 minutes in review of e-visit questionnaire, review and updating patient chart, medical decision making and response to patient.   Raneisha Bress Cody Pecola Haxton, PA-C    

## 2022-01-18 ENCOUNTER — Ambulatory Visit: Payer: 59 | Admitting: Psychiatry

## 2022-01-25 ENCOUNTER — Ambulatory Visit: Payer: 59 | Admitting: Psychiatry

## 2022-01-25 ENCOUNTER — Ambulatory Visit (INDEPENDENT_AMBULATORY_CARE_PROVIDER_SITE_OTHER): Payer: 59 | Admitting: Psychiatry

## 2022-02-18 ENCOUNTER — Telehealth: Payer: Self-pay | Admitting: Psychiatry

## 2022-02-18 NOTE — Telephone Encounter (Signed)
Last visit was 11/18/21. Cynthia Castaneda's mom, Cynthia Castaneda called. She is listed on the DPR to speak to. Mom states Cynthia Castaneda doesn't want to take her meds anymore. Is this ok and if so how does she taper off? Mom's phone number is [phone].

## 2022-02-22 NOTE — Telephone Encounter (Signed)
She can stop it if she has not been taking it consistently. If she has been taking it consistently, she can reduce to 1/2 tablet daily for one week and then stop.

## 2022-02-22 NOTE — Telephone Encounter (Signed)
Pt informed

## 2022-02-22 NOTE — Telephone Encounter (Signed)
Cynthia Castaneda stopped taking Abilify for a few days and tried to restart.When she started back she did not feel bad.She wants to stop med and wants to know how to wean off.She feels like she does not need it anymore

## 2023-02-08 ENCOUNTER — Ambulatory Visit
Admission: RE | Admit: 2023-02-08 | Discharge: 2023-02-08 | Disposition: A | Discharge status: Home / Self Care | Payer: 59 | Source: Ambulatory Visit | Attending: Emergency Medicine | Admitting: Emergency Medicine

## 2023-02-08 VITALS — BP 131/88 | HR 99 | Temp 98.4°F | Resp 18

## 2023-02-08 DIAGNOSIS — J029 Acute pharyngitis, unspecified: Secondary | ICD-10-CM

## 2023-02-08 LAB — GROUP A STREP BY PCR: Group A Strep by PCR: NOT DETECTED

## 2023-02-08 NOTE — ED Provider Notes (Signed)
[facility]    CSN: [id number] Arrival date & time: 02/08/23  1526      History   Chief Complaint Chief Complaint  Patient presents with   Sore Throat    Strep test - Entered by patient    HPI Cynthia Castaneda is a 27 y.o. female.   HPI  27 year old female with a past medical history significant for PTSD, ADHD, OCD, ARFID, and recurrent depression presents for evaluation of a bump on her tongue that she noticed yesterday.  She is also complaining of pain with swallowing.  She denies any fever.  Past Medical History:  Diagnosis Date   ADHD (attention deficit hyperactivity disorder)    Allergy    Anxiety    Borderline personality disorder (HCC)    Depression    Eating disorder    Frequent headaches    Migraine    OCD (obsessive compulsive disorder)    PTSD (post-traumatic stress disorder)     Patient Active Problem List   Diagnosis Date Noted   Temperature intolerance 04/21/2021   Weight gain finding 04/21/2021   Enlarged thyroid gland 04/21/2021   Chronic tension-type headache, not intractable 04/21/2021   Depression, recurrent (HCC) 04/21/2021   Severe needle phobia 04/21/2021   Personal history of nonsuicidal self-injury 04/21/2021   Chronic post-traumatic stress disorder (PTSD) 07/17/2018   Attention deficit hyperactivity disorder (ADHD), combined type, moderate 07/17/2018   Obsessive compulsive disorder 07/17/2018   Avoidant-restrictive food intake disorder (ARFID) 07/17/2018    History reviewed. No pertinent surgical history.  OB History   No obstetric history on file.      Home Medications    Prior to Admission medications   Medication Sig Start Date End Date Taking? Authorizing Provider  methylphenidate (RITALIN) 10 MG tablet Take 1 tablet (10 mg total) by mouth 2 (two) times daily. 01/04/22  Yes Corie Chiquito, PMHNP  ARIPiprazole (ABILIFY) 10 MG tablet Take 1 tablet (10 mg total) by mouth at bedtime. 11/18/21 02/16/22  Corie Chiquito, PMHNP  benzonatate (TESSALON) 100 MG capsule Take 1 capsule (100 mg total) by mouth 3 (three) times daily as needed for cough. 12/19/21   Waldon Merl, PA-C  methylphenidate (RITALIN) 10 MG tablet Take 1 tablet (10 mg total) by mouth 2 (two) times daily. 12/07/21 01/06/22  Corie Chiquito, PMHNP    Family History Family History  Problem Relation Age of Onset   Depression Mother    Mental illness Mother    OCD Mother    Memory loss Mother    ADD / ADHD Father    OCD Father    Depression Father    Mental illness Father    Depression Brother    Mental illness Brother    OCD Maternal Aunt    OCD Maternal Grandmother    Bladder Cancer Neg Hx    Kidney cancer Neg Hx     Social History Social History   Tobacco Use   Smoking status: Never   Smokeless tobacco: Never  Vaping Use   Vaping status: Never Used  Substance Use Topics   Alcohol use: Yes   Drug use: No     Allergies   Amoxicillin and Cefdinir   Review of Systems Review of Systems  Constitutional:  Negative for fever.  HENT:  Positive for sore throat.      Physical Exam Triage Vital Signs ED Triage Vitals  Encounter Vitals Group     BP 02/08/23 1547 131/88     Systolic BP  Percentile --      Diastolic BP Percentile --      Pulse Rate 02/08/23 1547 99     Resp 02/08/23 1547 18     Temp 02/08/23 1547 98.4 F (36.9 C)     Temp Source 02/08/23 1547 Oral     SpO2 02/08/23 1547 97 %     Weight --      Height --      Head Circumference --      Peak Flow --      Pain Score 02/08/23 1545 3     Pain Loc --      Pain Education --      Exclude from Growth Chart --    No data found.  Updated Vital Signs BP 131/88 (BP Location: Right Arm)   Pulse 99   Temp 98.4 F (36.9 C) (Oral)   Resp 18   LMP 01/18/2023 (Approximate)   SpO2 97%   Visual Acuity Right Eye Distance:   Left Eye Distance:   Bilateral Distance:    Right Eye Near:   Left Eye Near:    Bilateral Near:     Physical  Exam Vitals and nursing note reviewed.  Constitutional:      Appearance: Normal appearance. She is not ill-appearing.  HENT:     Head: Normocephalic and atraumatic.     Mouth/Throat:     Mouth: Mucous membranes are moist.     Pharynx: Oropharynx is clear. Posterior oropharyngeal erythema present. No oropharyngeal exudate.     Comments: Patient has prominent papilla on the posterior aspect of her tongue but no identifiable lesion.  Bilateral tonsillar pillars are erythematous and edematous but free of exudate.  There is erythema and injection to the posterior oropharynx as well. Skin:    General: Skin is warm and dry.     Capillary Refill: Capillary refill takes less than 2 seconds.  Neurological:     General: No focal deficit present.     Mental Status: She is alert and oriented to person, place, and time.      UC Treatments / Results  Labs (all labs ordered are listed, but only abnormal results are displayed) Labs Reviewed  GROUP A STREP BY PCR    EKG   Radiology No results found.  Procedures Procedures (including critical care time)  Medications Ordered in UC Medications - No data to display  Initial Impression / Assessment and Plan / UC Course  I have reviewed the triage vital signs and the nursing notes.  Pertinent labs & imaging results that were available during my care of the patient were reviewed by me and considered in my medical decision making (see chart for details).   Patient is a nontoxic-appearing 27 year old female presenting for evaluation of painful swallowing and a questionable bump on her tongue that she noticed yesterday.  On exam she does have prominent papillae on the back of her tongue but no identifiable lesion.  Tonsillar pillars are edematous and erythematous but free of exudate.  She is endorsing pain with swallowing.  I will order a strep PCR to evaluate for the presence of strep.  PCR is negative.  I will discharge patient on the diagnosis  of viral pharyngitis and have her use over-the-counter Tylenol and or ibuprofen as needed for pain along with over-the-counter Chloraseptic or Sucrets lozenges.  We also discussed salt water gargles.  Return precautions reviewed.   Final Clinical Impressions(s) / UC Diagnoses   Final diagnoses:  Viral  pharyngitis     Discharge Instructions      Your strep test today is negative.  I do believe that your sore throat is being caused by a viral infection.  Gargle with warm salt water 2-3 times a day to soothe your throat, aid in pain relief, and aid in healing.  Take over-the-counter Tylenol and/or ibuprofen according to the package instructions as needed for pain.  You can also use Chloraseptic or Sucrets lozenges, 1 lozenge every 2 hours as needed for throat pain.  If you develop any new or worsening symptoms return for reevaluation.      ED Prescriptions   None    PDMP not reviewed this encounter.   Becky Augusta, NP 02/08/23 1640

## 2023-02-08 NOTE — ED Triage Notes (Signed)
Patient states that she has a bump on her tongue that she noticed yesterday. Reoccurring. Feels like a cold sore. Makes it hurt to swallow

## 2023-02-08 NOTE — Discharge Instructions (Addendum)
Your strep test today is negative.  I do believe that your sore throat is being caused by a viral infection.  Gargle with warm salt water 2-3 times a day to soothe your throat, aid in pain relief, and aid in healing.  Take over-the-counter Tylenol and/or ibuprofen according to the package instructions as needed for pain.  You can also use Chloraseptic or Sucrets lozenges, 1 lozenge every 2 hours as needed for throat pain.  If you develop any new or worsening symptoms return for reevaluation.

## 2023-03-01 ENCOUNTER — Encounter: Payer: Self-pay | Admitting: Psychiatry

## 2023-06-30 ENCOUNTER — Telehealth: Payer: Self-pay | Admitting: Family Medicine

## 2023-06-30 NOTE — Telephone Encounter (Signed)
 Patient scheduled.

## 2023-06-30 NOTE — Telephone Encounter (Signed)
 Has not been seen in nearly 2 years and have no idea why she is requesting referral. Need o.v.

## 2023-06-30 NOTE — Telephone Encounter (Signed)
 Copied from CRM [id number]. Topic: Referral - Request for Referral >> Jun 30, 2023 12:59 PM Higinio Roger wrote: Did the patient discuss referral with their provider in the last year? Yes (If No - schedule appointment) (If Yes - send message)  Appointment offered? No  Type of order/referral and detailed reason for visit: gastroenterology  Preference of office, provider, location: [place] or [place]  If referral order, have you been seen by this specialty before? No (If Yes, this issue or another issue? When? Where?  Can we respond through MyChart? Yes

## 2023-07-10 ENCOUNTER — Ambulatory Visit: Payer: Self-pay | Admitting: Family Medicine

## 2023-11-11 IMAGING — US US THYROID
1 series · 14 of 25 positions shown · non-contrast
Comparison: None.

CLINICAL DATA: Goiter.

EXAM:
THYROID ULTRASOUND
TECHNIQUE: Ultrasound examination of the thyroid gland and adjacent soft
tissues was performed.

[Series 1: us thyroid · 0.07mm/px · 14 of 37 slices shown]
[im 1/37]
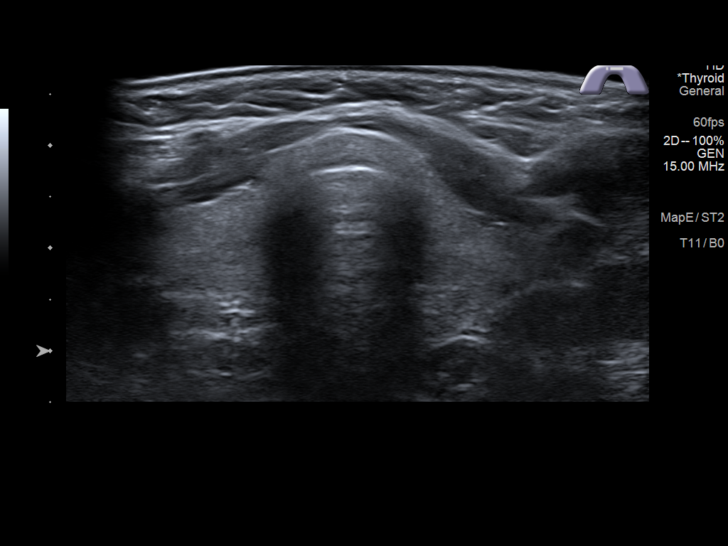
[im 4/37]
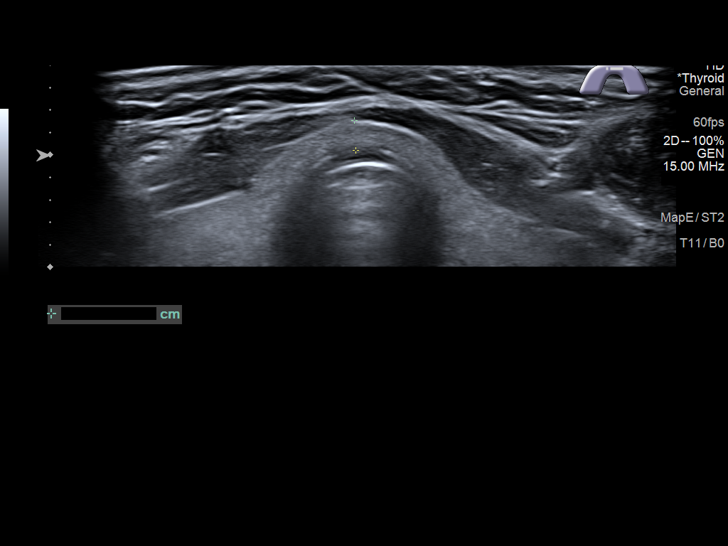
[im 7/37]
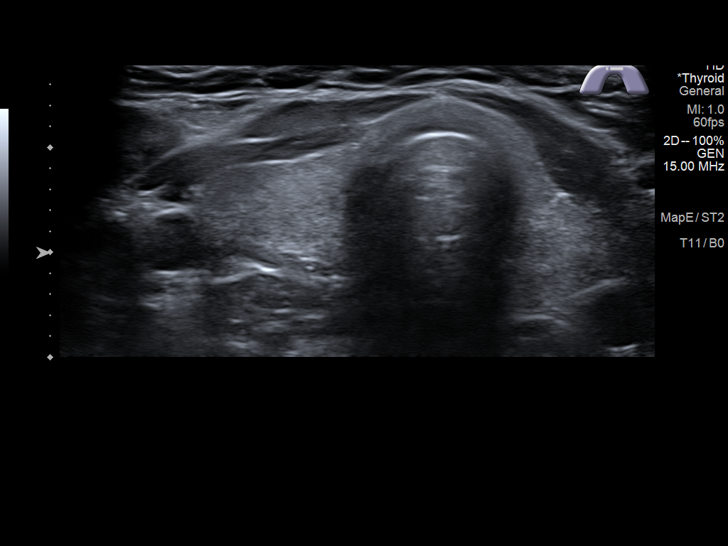
[im 10/37]
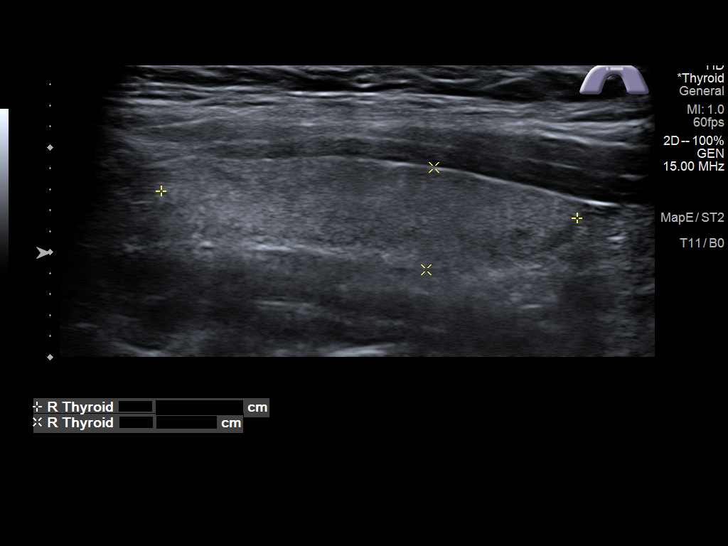
[im 13/37]
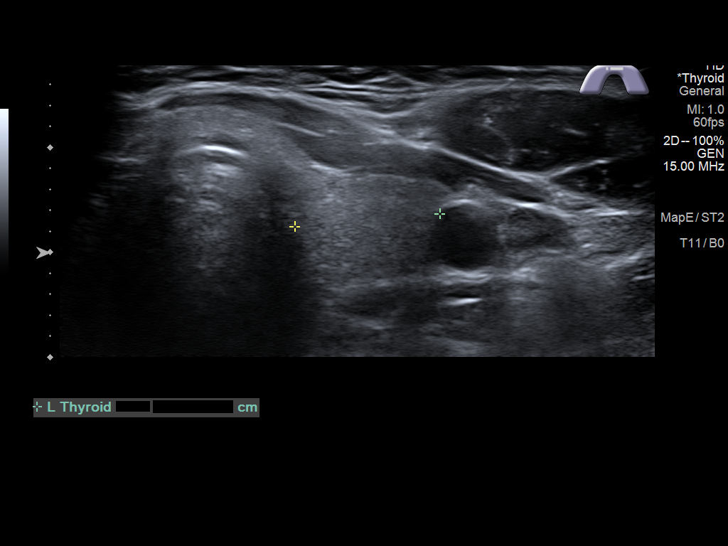
[im 14/37]
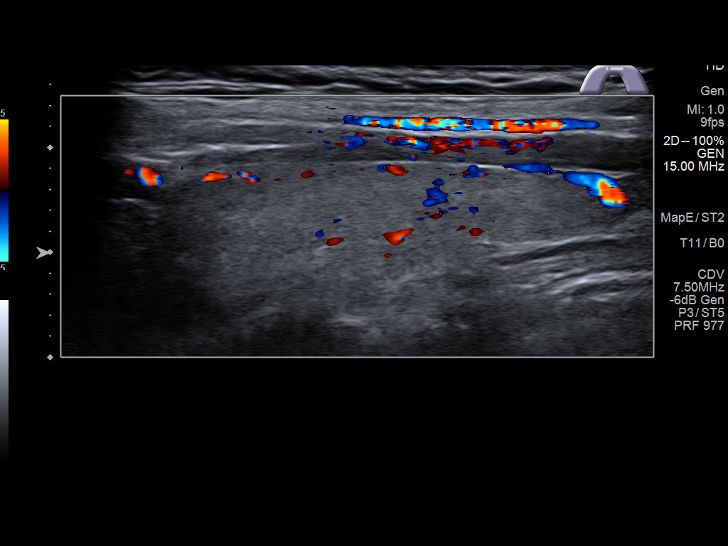
[im 17/37]
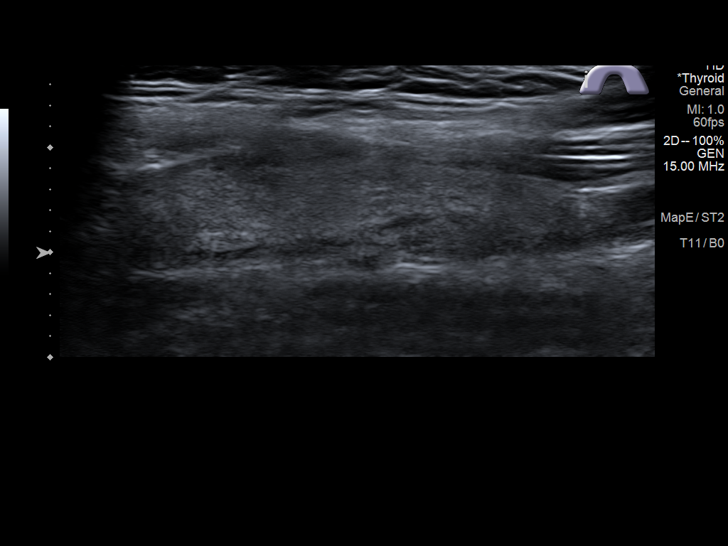
[im 20/37]
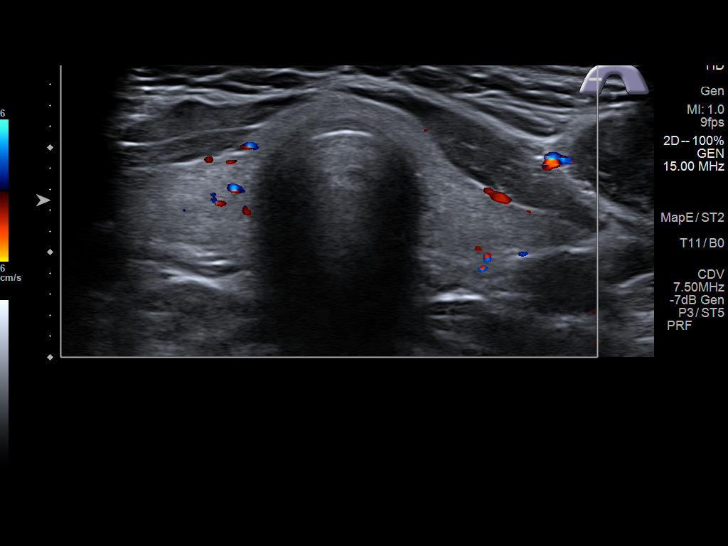
[im 23/37]
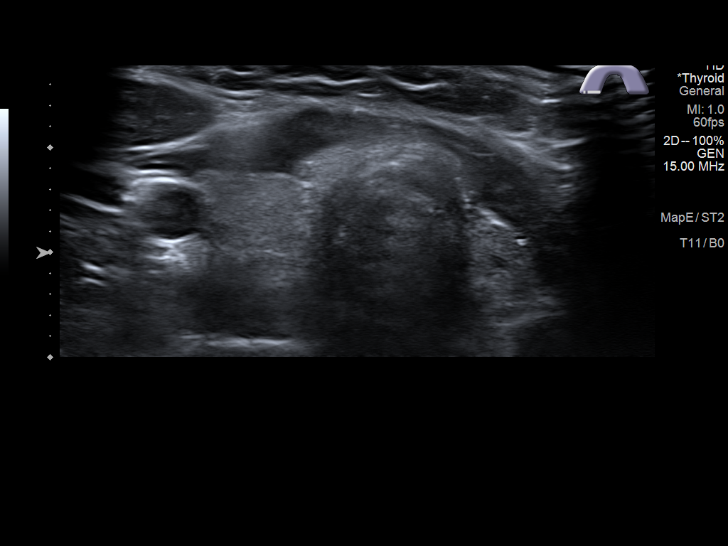
[im 25/37]
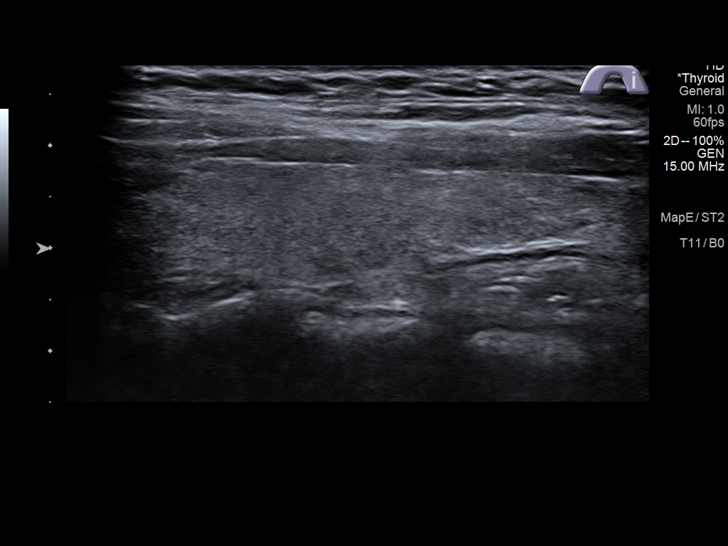
[im 28/37]
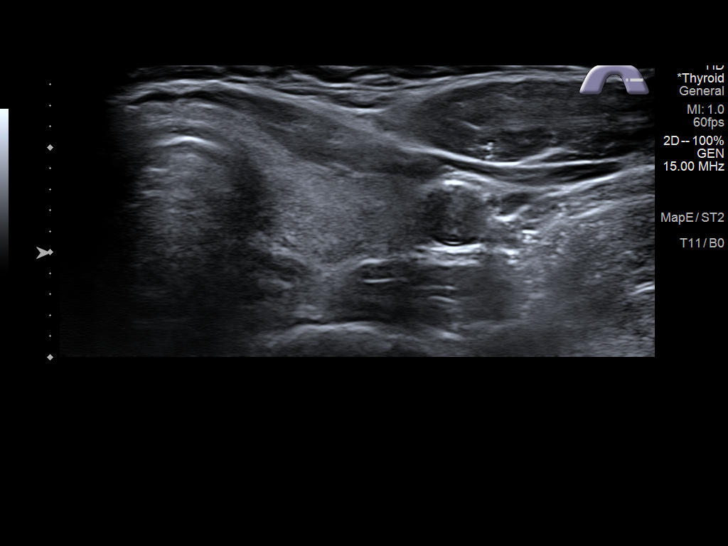
[im 31/37]
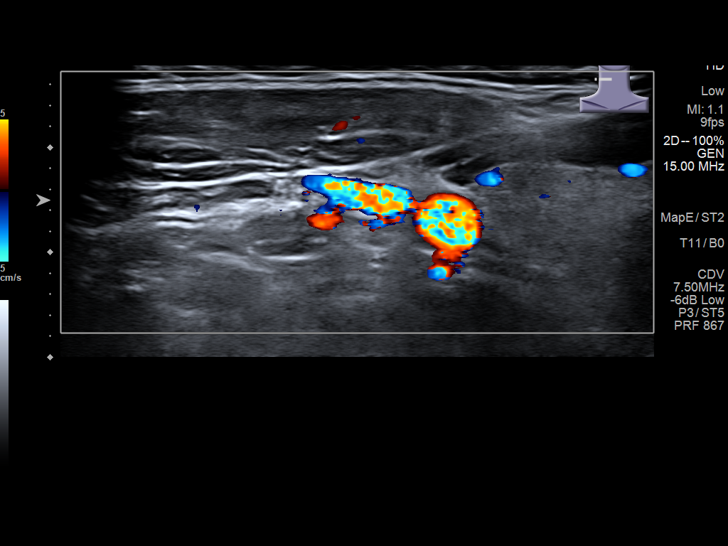
[im 34/37]
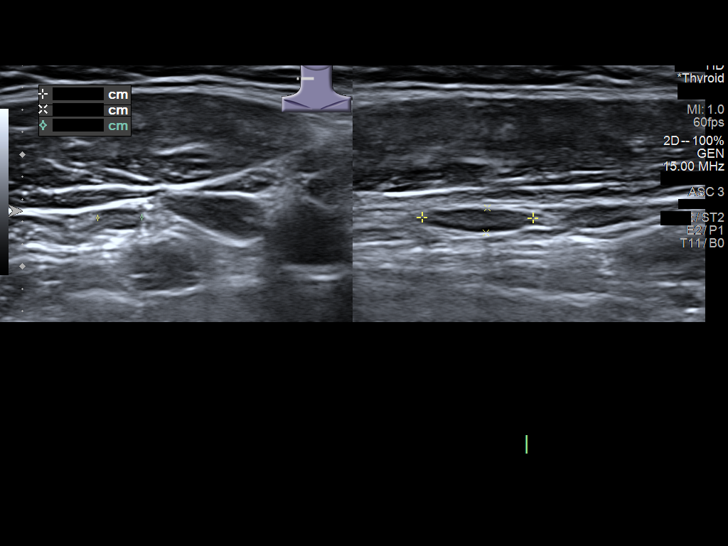
[im 37/37]
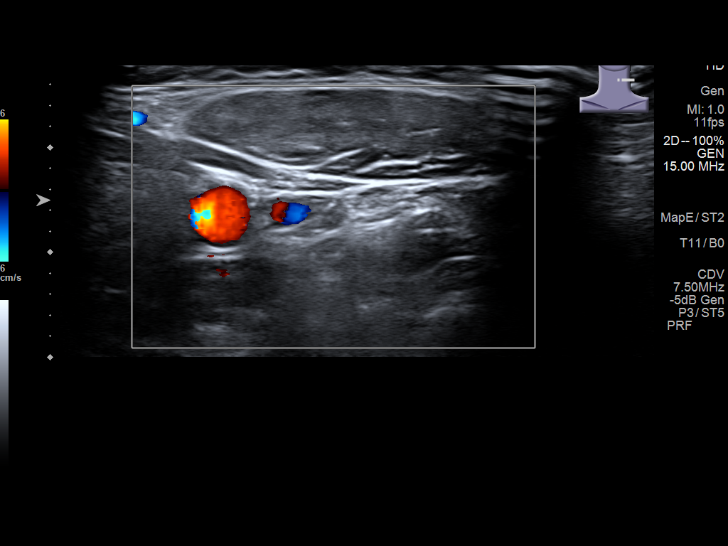

[14 of 25 positions shown; findings below may reference images not displayed]

FINDINGS: Parenchymal Echotexture: Normal

Isthmus: 0.3 cm

Right lobe: 4.0 x 1.0 x 1.4 cm

Left lobe: 4.4 x 1.3 x 1.4 cm

_________________________________________________________

Estimated total number of nodules >/= 1 cm: 0

Number of spongiform nodules >/=  2 cm not described below (TR1): 0

Number of mixed cystic and solid nodules >/= 1.5 cm not described
below (TR2): 0

_________________________________________________________

No discrete nodules are seen within the thyroid gland.
IMPRESSION: Normal size and sonographic appearance of the thyroid gland.
